# Patient Record
Sex: Female | Born: 1960 | Race: White | Hispanic: No | Marital: Married | Smoking: Former smoker
Health system: Southern US, Community
[De-identification: ages and names within clinical notes are randomized; demographics above are authoritative.]

## PROBLEM LIST (undated history)

## (undated) DIAGNOSIS — J309 Allergic rhinitis, unspecified: Secondary | ICD-10-CM

## (undated) DIAGNOSIS — Z8719 Personal history of other diseases of the digestive system: Secondary | ICD-10-CM

## (undated) DIAGNOSIS — R918 Other nonspecific abnormal finding of lung field: Secondary | ICD-10-CM

## (undated) DIAGNOSIS — G4730 Sleep apnea, unspecified: Secondary | ICD-10-CM

## (undated) DIAGNOSIS — M722 Plantar fascial fibromatosis: Secondary | ICD-10-CM

## (undated) DIAGNOSIS — T7840XA Allergy, unspecified, initial encounter: Secondary | ICD-10-CM

## (undated) DIAGNOSIS — J45909 Unspecified asthma, uncomplicated: Secondary | ICD-10-CM

## (undated) DIAGNOSIS — G4733 Obstructive sleep apnea (adult) (pediatric): Secondary | ICD-10-CM

## (undated) DIAGNOSIS — K219 Gastro-esophageal reflux disease without esophagitis: Secondary | ICD-10-CM

## (undated) DIAGNOSIS — Z9989 Dependence on other enabling machines and devices: Secondary | ICD-10-CM

## (undated) HISTORY — DX: Unspecified asthma, uncomplicated: J45.909

## (undated) HISTORY — DX: Sleep apnea, unspecified: G47.30

## (undated) HISTORY — PX: UPPER GASTROINTESTINAL ENDOSCOPY: SHX188

## (undated) HISTORY — DX: Allergy, unspecified, initial encounter: T78.40XA

## (undated) HISTORY — DX: Other nonspecific abnormal finding of lung field: R91.8

## (undated) HISTORY — DX: Dependence on other enabling machines and devices: Z99.89

## (undated) HISTORY — DX: Morbid (severe) obesity due to excess calories: E66.01

## (undated) HISTORY — DX: Plantar fascial fibromatosis: M72.2

## (undated) HISTORY — DX: Obstructive sleep apnea (adult) (pediatric): G47.33

---

## 1997-12-11 ENCOUNTER — Other Ambulatory Visit: Admission: RE | Admit: 1997-12-11 | Discharge: 1997-12-11 | Payer: Self-pay | Admitting: Obstetrics and Gynecology

## 1998-12-12 ENCOUNTER — Other Ambulatory Visit: Admission: RE | Admit: 1998-12-12 | Discharge: 1998-12-12 | Payer: Self-pay | Admitting: Obstetrics and Gynecology

## 2000-03-04 ENCOUNTER — Other Ambulatory Visit: Admission: RE | Admit: 2000-03-04 | Discharge: 2000-03-04 | Payer: Self-pay | Admitting: Obstetrics and Gynecology

## 2001-06-21 ENCOUNTER — Other Ambulatory Visit: Admission: RE | Admit: 2001-06-21 | Discharge: 2001-06-21 | Payer: Self-pay | Admitting: Obstetrics and Gynecology

## 2002-08-04 ENCOUNTER — Other Ambulatory Visit: Admission: RE | Admit: 2002-08-04 | Discharge: 2002-08-04 | Payer: Self-pay | Admitting: Obstetrics and Gynecology

## 2003-10-09 ENCOUNTER — Other Ambulatory Visit: Admission: RE | Admit: 2003-10-09 | Discharge: 2003-10-09 | Payer: Self-pay | Admitting: Obstetrics and Gynecology

## 2004-10-21 ENCOUNTER — Other Ambulatory Visit: Admission: RE | Admit: 2004-10-21 | Discharge: 2004-10-21 | Payer: Self-pay | Admitting: Obstetrics and Gynecology

## 2005-03-17 HISTORY — PX: PLANTAR FASCIA SURGERY: SHX746

## 2012-07-14 ENCOUNTER — Ambulatory Visit (INDEPENDENT_AMBULATORY_CARE_PROVIDER_SITE_OTHER): Payer: BC Managed Care – PPO | Admitting: Surgery

## 2012-07-22 ENCOUNTER — Ambulatory Visit (INDEPENDENT_AMBULATORY_CARE_PROVIDER_SITE_OTHER): Payer: BC Managed Care – PPO | Admitting: General Surgery

## 2012-07-22 ENCOUNTER — Encounter (INDEPENDENT_AMBULATORY_CARE_PROVIDER_SITE_OTHER): Payer: Self-pay | Admitting: General Surgery

## 2012-07-22 VITALS — BP 122/96 | HR 72 | Temp 97.6°F | Resp 14 | Ht 60.0 in | Wt 208.6 lb

## 2012-07-22 DIAGNOSIS — Z6841 Body Mass Index (BMI) 40.0 and over, adult: Secondary | ICD-10-CM

## 2012-07-22 DIAGNOSIS — J45909 Unspecified asthma, uncomplicated: Secondary | ICD-10-CM | POA: Insufficient documentation

## 2012-07-22 NOTE — Patient Instructions (Signed)
We will start the process. Please call with any questions. We will need copy of your sleep study

## 2012-07-22 NOTE — Progress Notes (Signed)
Patient ID: Katherine Henry, female   DOB: [birth year], 52 y.o.   MRN: [id number]  Chief Complaint  Patient presents with  . Weight Loss Surgery    HPI Katherine Henry is a 52 y.o. female.   HPI 52 yo WF referred by Dr Brendolyn Patty for evaluation of weight loss surgery. She states that she is specifically interested in laparoscopic adjustable gastric band surgery. If the pills to her because it is reversible and not permanent. She has a friend who has had both a lap band as well as gastric bypass. After attending a seminar, she believes the LAP-BAND is the best procedure for her.  She states that she has struggled premature all of her adult life with her weight. She describes yo-yo dieting in the 2000s & 1990s. She has tried numerous attempts for sustained weight loss. She has tried [employer], [employer], bariatric clinic, physicians weight loss, Xenical, and phentermine. She was successful with physicians weight loss with a 50 pound weight loss but subsequently regained all the way back. Despite all these attempts she has been unable to achieve permanent weight loss.  Past Medical History  Diagnosis Date  . Plantar fasciitis     Past Surgical History  Procedure Laterality Date  . Cesarean section  1998    Family History  Problem Relation Age of Onset  . Cancer Father     cancer    Social History History  Substance Use Topics  . Smoking status: Former Games developer  . Smokeless tobacco: Not on file  . Alcohol Use: Yes    Allergies  Allergen Reactions  . Augmentin (Amoxicillin-Pot Clavulanate) Nausea And Vomiting    Current Outpatient Prescriptions  Medication Sig Dispense Refill  . ALBUTEROL SULFATE HFA IN Inhale into the lungs 2 (two) times daily.      . Azelastine-Fluticasone (DYMISTA NA) Place into the nose 2 (two) times daily.      Marland Kitchen EPINEPHrine (ADRENALIN) 0.1 MG/ML injection Inject 0.3 mg into the vein once.      . loratadine (CLARITIN) 10 MG tablet Take 10 mg by mouth at  bedtime as needed for allergies.      . mometasone-formoterol (DULERA) 100-5 MCG/ACT AERO Inhale 2 puffs into the lungs 2 (two) times daily.      . montelukast (SINGULAIR) 10 MG tablet Take 10 mg by mouth every morning.      . zolpidem (AMBIEN) 10 MG tablet Take 10 mg by mouth at bedtime as needed for sleep.       No current facility-administered medications for this visit.    Review of Systems Review of Systems  Constitutional: Negative for fever, chills and unexpected weight change.  HENT: Negative for hearing loss, congestion, sore throat, trouble swallowing and voice change.   Eyes: Negative for visual disturbance.  Respiratory: Negative for cough, chest tightness, shortness of breath and wheezing.        +asthma. Sees a pulmonologist. Getting a sleep study in [place] in the next week.   Cardiovascular: Negative for chest pain, palpitations and leg swelling.       Some LE edema. No PND. No orthopnea. No claudication  Gastrointestinal: Negative for nausea, vomiting, abdominal pain, diarrhea, constipation, blood in stool, abdominal distention and anal bleeding.       BM q vs qoday. Denies reflux  Genitourinary: Negative for hematuria, vaginal bleeding and difficulty urinating.       Wears IUD. G1P1.   Musculoskeletal: Negative for arthralgias.  Has b/l hip and knee pain  Skin: Negative for rash and wound.  Neurological: Negative for seizures, syncope and headaches.       Denies TIA, amaurosis fugax  Hematological: Negative for adenopathy. Does not bruise/bleed easily.  Psychiatric/Behavioral: Negative for confusion.    Blood pressure 122/96, pulse 72, temperature 97.6 F (36.4 C), resp. rate 14, height 5' (1.524 m), weight 208 lb 9.6 oz (94.62 kg).  Physical Exam Physical Exam  Constitutional: She is oriented to person, place, and time. She appears well-developed and well-nourished. No distress.  Truncal obesity  HENT:  Head: Normocephalic and atraumatic.  Right Ear:  External ear normal.  Left Ear: External ear normal.  Eyes: Conjunctivae and EOM are normal. No scleral icterus.  Neck: Normal range of motion. Neck supple. No tracheal deviation present. No thyromegaly present.  Cardiovascular: Normal rate, normal heart sounds and intact distal pulses.   Pulmonary/Chest: Effort normal and breath sounds normal. No respiratory distress. She has no wheezes.  Abdominal: Soft. She exhibits no distension. There is no tenderness. There is no rebound and no guarding.    Musculoskeletal: Normal range of motion. She exhibits no edema and no tenderness.  Lymphadenopathy:    She has no cervical adenopathy.  Neurological: She is alert and oriented to person, place, and time. She exhibits normal muscle tone.  Skin: Skin is warm and dry. No rash noted. She is not diaphoretic. No erythema.  Psychiatric: She has a normal mood and affect. Her behavior is normal. Judgment and thought content normal.    Data Reviewed Diet history form  Assessment    Morbid obesity BMI 40. 74 Asthma Joint Pain     Plan    The patient meets weight loss surgery criteria. I think the patient would be an acceptable candidate for Laparoscopic adjustable gastric band placement.  We discussed laparoscopic adjustable gastric banding. The patient was given Agricultural engineer. We discussed the risk and benefits of surgery including but not limited to bleeding, infection, injury to surrounding structures, blood clot formation such as deep venous thrombosis or pulmonary embolism, need to convert to an open procedure, band slippage, band erosion, failure to loose weight, port complications (leak or flippage), potential need for reoperative surgery, esophageal dilatation, worsening reflux, and vitamin deficiencies. We discussed the typical post operative recovery course. We discussed that their postoperative diet will be modified for several weeks. We specifically talked about the need to be on a  liquid diet for one to 2 weeks after surgery. We also discussed the typical postoperative course with a laparoscopic adjustable gastric band and the need for frequent postoperative visits to assess the volume status of the band.  We discussed the typical expected weight loss with a laparoscopic adjustable gastric band. I explained to the patient that they can expect to lose 40-60% of their excess body weight if they are compliant with their postoperative instructions. However I did explain that some patients loose less than 40% and some patients lose more than 60% of their excess body weight.  I explained that the likelihood of improvement in their obesity is good.  I explained to the patient that we will start our evaluation process which includes labs, Upper GI to evaluate stomach and swallowing anatomy, nutritionist consultation, psychiatrist consultation, EKG, CXR, abdominal ultrasound.  She is scheduled to get her mammogram next month and we will request a copy of that. She is also scheduled to get a sleep study done in  in the next week or so.  We will obtain results of that. She states he also had recent lab work done at her primary care physician's office. We will get copies of that. We will order any missing labs at that time.  [name]. Andrey Campanile, MD, FACS General, Bariatric, & Minimally Invasive Surgery [facility], [place]           [facility] [place] M 07/22/2012, 5:24 PM

## 2012-07-26 ENCOUNTER — Telehealth (INDEPENDENT_AMBULATORY_CARE_PROVIDER_SITE_OTHER): Payer: Self-pay | Admitting: General Surgery

## 2012-07-26 NOTE — Addendum Note (Signed)
Addended byLiliana Cline on: 07/26/2012 12:44 PM   Modules accepted: Orders

## 2012-07-26 NOTE — Addendum Note (Signed)
Addended byLiliana Cline on: 07/26/2012 12:48 PM   Modules accepted: Orders

## 2012-07-26 NOTE — Telephone Encounter (Signed)
Called patient and made her aware we received recent blood work results from her PCP. They were missing an H. Pylori, T4 and Coags. I put in orders for remaining lab work and patient will have drawn at her local [facility]. She will call with any questions.

## 2012-08-04 ENCOUNTER — Ambulatory Visit: Payer: Self-pay | Admitting: *Deleted

## 2012-08-04 ENCOUNTER — Encounter (INDEPENDENT_AMBULATORY_CARE_PROVIDER_SITE_OTHER): Payer: Self-pay

## 2012-08-04 LAB — T4: T4, Total: 8.1 ug/dL (ref 4.5–12.0)

## 2012-08-04 LAB — PROTIME-INR: Prothrombin Time: 10.2 s (ref 9.1–12.0)

## 2012-08-24 ENCOUNTER — Encounter: Payer: BC Managed Care – PPO | Attending: General Surgery | Admitting: *Deleted

## 2012-08-24 ENCOUNTER — Encounter: Payer: Self-pay | Admitting: *Deleted

## 2012-08-24 VITALS — Ht 60.0 in | Wt 207.9 lb

## 2012-08-24 DIAGNOSIS — Z713 Dietary counseling and surveillance: Secondary | ICD-10-CM | POA: Insufficient documentation

## 2012-08-24 DIAGNOSIS — Z01818 Encounter for other preprocedural examination: Secondary | ICD-10-CM | POA: Insufficient documentation

## 2012-08-24 NOTE — Patient Instructions (Addendum)
   Follow Pre-Op Nutrition Goals to prepare for Bariatric Surgery.   Call the [facility] at [phone] once you have been given your surgery date to enrolled in the Pre-Op Nutrition Class. You will need to attend this nutrition class 3-4 weeks prior to your surgery.  

## 2012-08-30 ENCOUNTER — Ambulatory Visit (HOSPITAL_COMMUNITY)
Admission: RE | Admit: 2012-08-30 | Discharge: 2012-08-30 | Disposition: A | Discharge status: Home / Self Care | Payer: BC Managed Care – PPO | Source: Ambulatory Visit | Attending: General Surgery | Admitting: General Surgery

## 2012-08-30 ENCOUNTER — Other Ambulatory Visit: Payer: Self-pay

## 2012-08-30 DIAGNOSIS — M722 Plantar fascial fibromatosis: Secondary | ICD-10-CM | POA: Insufficient documentation

## 2012-08-30 DIAGNOSIS — J45909 Unspecified asthma, uncomplicated: Secondary | ICD-10-CM | POA: Insufficient documentation

## 2012-08-30 DIAGNOSIS — M2550 Pain in unspecified joint: Secondary | ICD-10-CM | POA: Insufficient documentation

## 2012-08-30 DIAGNOSIS — Z6841 Body Mass Index (BMI) 40.0 and over, adult: Secondary | ICD-10-CM | POA: Insufficient documentation

## 2012-08-30 DIAGNOSIS — K449 Diaphragmatic hernia without obstruction or gangrene: Secondary | ICD-10-CM | POA: Insufficient documentation

## 2012-08-30 DIAGNOSIS — Z87891 Personal history of nicotine dependence: Secondary | ICD-10-CM | POA: Insufficient documentation

## 2012-09-07 ENCOUNTER — Other Ambulatory Visit (INDEPENDENT_AMBULATORY_CARE_PROVIDER_SITE_OTHER): Payer: Self-pay | Admitting: General Surgery

## 2012-09-19 ENCOUNTER — Encounter: Payer: Self-pay | Admitting: *Deleted

## 2012-09-19 NOTE — Progress Notes (Addendum)
  Pre-Op Assessment Visit:  Pre-Operative Sleeve Gastrectomy Surgery  Medical Nutrition Therapy:  Appt start time: 0800   End time:  0900.  Patient was seen on 08/24/12 for Pre-Operative Sleeve Gastrectomy Nutrition Assessment. Assessment and letter of approval faxed to [facility] Bariatric Surgery Program coordinator on 09/03/12.  Approval letter sent to [facility] center and will be available in the chart under the media tab.  Handouts given during visit include:  Pre-Op Goals   Bariatric Surgery Protein Shakes  Samples given during visit include:   Unjury Protein Powder: 2 pkts Lot: [id number]; Exp: 09/15  Patient to call for Pre-Op and Post-Op Nutrition Education at the [facility] when surgery is scheduled.

## 2012-09-21 ENCOUNTER — Telehealth (INDEPENDENT_AMBULATORY_CARE_PROVIDER_SITE_OTHER): Payer: Self-pay | Admitting: General Surgery

## 2012-09-21 NOTE — Telephone Encounter (Signed)
Message copied by June Leap on Tue Sep 21, 2012  4:29 PM ------      Message from: Leandrew Koyanagi      Created: Wed Sep 15, 2012 10:20 AM      Regarding: CPAP/Sleep Study       Hey Dr. Andrey Campanile,            I spoke to Weaver this morning and she states she has finished the sleep study and is on CPAP now. She said the CPAP is really helping her. I scheduled her surgery for 10/18/12. She will see you for pre-op on 10/08/12. She states her doctor sent the results/reports and I advised that we have not received them yet. I asked if she will contact them and have the results refaxed.            Thanks,      Coy Saunas ------

## 2012-10-07 ENCOUNTER — Encounter: Payer: BC Managed Care – PPO | Attending: General Surgery

## 2012-10-07 VITALS — Ht 60.0 in | Wt 212.5 lb

## 2012-10-07 DIAGNOSIS — Z713 Dietary counseling and surveillance: Secondary | ICD-10-CM | POA: Insufficient documentation

## 2012-10-07 DIAGNOSIS — Z01818 Encounter for other preprocedural examination: Secondary | ICD-10-CM | POA: Insufficient documentation

## 2012-10-08 ENCOUNTER — Encounter (INDEPENDENT_AMBULATORY_CARE_PROVIDER_SITE_OTHER): Payer: Self-pay | Admitting: General Surgery

## 2012-10-08 ENCOUNTER — Encounter (HOSPITAL_COMMUNITY): Payer: Self-pay | Admitting: Pharmacy Technician

## 2012-10-08 ENCOUNTER — Ambulatory Visit (INDEPENDENT_AMBULATORY_CARE_PROVIDER_SITE_OTHER): Payer: BC Managed Care – PPO | Admitting: General Surgery

## 2012-10-08 VITALS — BP 108/72 | HR 76 | Temp 97.5°F | Resp 16 | Ht 60.0 in | Wt 216.8 lb

## 2012-10-08 DIAGNOSIS — Z6841 Body Mass Index (BMI) 40.0 and over, adult: Secondary | ICD-10-CM

## 2012-10-08 DIAGNOSIS — Z9989 Dependence on other enabling machines and devices: Secondary | ICD-10-CM | POA: Insufficient documentation

## 2012-10-08 DIAGNOSIS — G4733 Obstructive sleep apnea (adult) (pediatric): Secondary | ICD-10-CM | POA: Insufficient documentation

## 2012-10-08 NOTE — Patient Instructions (Signed)
Exercise daily Keep up with the preop diet Bring CPAP mask to hospital

## 2012-10-08 NOTE — Progress Notes (Signed)
Patient ID: Katherine Henry, female   DOB: 1961-02-11, 52 y.o.   MRN: [id number]  Chief Complaint  Patient presents with  . Bariatric Pre-op    HPI Katherine Henry is a 52 y.o. female.  HPI 52 year old morbidly obese Caucasian female comes in today for her preoperative appointment. She has been approved for laparoscopic adjustable gastric band surgery which is currently scheduled for August 4th. I initially saw her on 07/22/2012. She denies any significant changes to her medical history other than she tested positive for sleep apnea. She was fitted for CPAP. She reports increased energy since she has been using the CPAP machine. She denies any trips to the hospital. She just started her preoperative diet yesterday.  PMHx, PSHx, SOCHx, FAMHx, ALL reviewed   Past Medical History  Diagnosis Date  . Plantar fasciitis   . Morbid obesity   . Asthma   . OSA on CPAP     Past Surgical History  Procedure Laterality Date  . Cesarean section  1998  . Plantar fascia surgery      Patient reported    Family History  Problem Relation Age of Onset  . Cancer Father     cancer    Social History History  Substance Use Topics  . Smoking status: Former Smoker    Quit date: 08/24/1992  . Smokeless tobacco: Not on file  . Alcohol Use: Yes     Comment: Rare; Holidays    Allergies  Allergen Reactions  . Augmentin (Amoxicillin-Pot Clavulanate) Nausea And Vomiting    Current Outpatient Prescriptions  Medication Sig Dispense Refill  . ALBUTEROL SULFATE HFA IN Inhale into the lungs 2 (two) times daily.      . Azelastine-Fluticasone (DYMISTA NA) Place into the nose 2 (two) times daily.      Marland Kitchen EPINEPHrine (ADRENALIN) 0.1 MG/ML injection Inject 0.3 mg into the vein once.      . ergocalciferol (VITAMIN D2) 50000 UNITS capsule Take 50,000 Units by mouth once a week.      . loratadine (CLARITIN) 10 MG tablet Take 10 mg by mouth at bedtime as needed for allergies.      . mometasone-formoterol (DULERA)  100-5 MCG/ACT AERO Inhale 2 puffs into the lungs 2 (two) times daily.      . montelukast (SINGULAIR) 10 MG tablet Take 10 mg by mouth every morning.      Marland Kitchen UNABLE TO FIND Inject into the skin once a week. Med Name: ALLERGY SHOT      . zolpidem (AMBIEN) 10 MG tablet Take 10 mg by mouth at bedtime as needed for sleep.       No current facility-administered medications for this visit.    Review of Systems Review of Systems  Constitutional: Negative for fever, chills and unexpected weight change.  HENT: Negative for hearing loss, congestion, sore throat, trouble swallowing and voice change.   Eyes: Negative for visual disturbance.  Respiratory: Negative for cough and wheezing.        Positive asthma. Tested positive for sleep apnea. Has started CPAP.  Cardiovascular: Negative for chest pain, palpitations and leg swelling.  Gastrointestinal: Negative for nausea, vomiting, abdominal pain, diarrhea, constipation and blood in stool.       Denies reflux  Genitourinary: Negative for hematuria, vaginal bleeding and difficulty urinating.  Musculoskeletal: Negative for arthralgias.       Bilateral hip and knee pain  Skin: Negative for rash and wound.  Neurological: Negative for dizziness, seizures, syncope and headaches.  Hematological:  Negative for adenopathy. Does not bruise/bleed easily.  Psychiatric/Behavioral: Negative for confusion.    Blood pressure 108/72, pulse 76, temperature 97.5 F (36.4 C), temperature source Oral, resp. rate 16, height 5' (1.524 m), weight 216 lb 12.8 oz (98.34 kg).  Physical Exam Physical Exam  Vitals reviewed. Constitutional: She is oriented to person, place, and time. She appears well-developed and well-nourished. No distress.  Morbidly obese  HENT:  Head: Normocephalic and atraumatic.  Right Ear: External ear normal.  Left Ear: External ear normal.  Eyes: Conjunctivae are normal. No scleral icterus.  Neck: Normal range of motion. Neck supple. No tracheal  deviation present. No thyromegaly present.  Cardiovascular: Normal rate and normal heart sounds.   Pulmonary/Chest: Effort normal and breath sounds normal. No stridor. No respiratory distress. She has no wheezes.  Abdominal: Soft. She exhibits no distension. There is no tenderness. There is no rebound and no guarding.    Musculoskeletal: She exhibits no edema and no tenderness.  Lymphadenopathy:    She has no cervical adenopathy.  Neurological: She is alert and oriented to person, place, and time. She exhibits normal muscle tone.  Skin: Skin is warm and dry. No rash noted. She is not diaphoretic. No erythema.  Psychiatric: She has a normal mood and affect. Her behavior is normal. Judgment and thought content normal.    Data Reviewed My office note 5/8 Labs - chol 208, LDL 149; o/w eval labs ok UGI - small hiatal hernia U/s - normal Sleep study - +OSA  Assessment    Morbid obesity BMI 42.3 OSA on CPAP Small hiatal hernia Dyslipidemia Asthma Joint pain     Plan    We reviewed her preoperative workup. We discussed the findings of a small hiatal hernia on her upper GI. Currently she has no symptoms of GERD. However she does have asthma. I told her we would test for a hiatal hernia during surgery. If we found that she had a significant hernia during surgery we would do a hiatal hernia repair during surgery. We reviewed her preoperative labs and discussed her elevated cholesterol and LDL lipids. She was told to bring her CPAP mask with her to the hospital.  We discussed her weight gain since her initial visit. Her weight during her initial visit was 208 pounds. Today is 216.8. We discussed the importance of weight loss during her preoperative diet plan in order to help shrink her liver. We discussed the importance of routine diet and exercise after surgery in order for her to achieve her goals. All of her questions were asked and answered.  Katherine Henry. Andrey Campanile, MD, FACS General, Bariatric,  & Minimally Invasive Surgery [facility], [place]         [facility] M 10/08/2012, 11:32 AM

## 2012-10-12 NOTE — Progress Notes (Signed)
OV Dr Esther Hardy 6/14 on chart, with Sleep study.  Chest x ray 5/14 EPIC, EKG 6/14 EPIC

## 2012-10-12 NOTE — Patient Instructions (Addendum)
20 Katherine Henry  10/12/2012   Your procedure is scheduled on:  10/18/12  MONDAY  Report to [facility] at 0715      AM.  Call this number if you have problems the morning of surgery: [phone]     BRING CPAP MASK AND TUBING WITH YOU TO HOSPITAL  Remember: Janan Halter WITH YOU TO HOSPITAL  Do not eat food  Or drink :After Midnight. Sunday NIGHT   Take these medicines the morning of surgery with A SIP OF WATER: Singulair, Pepcid,                    Dymista, Dulura                                      May use albuterol if needed   .  Contacts, dentures or partial plates can not be worn to surgery  Leave suitcase in the car. After surgery it may be brought to your room.  For patients admitted to the hospital, checkout time is 11:00 AM day of  discharge.             SPECIAL INSTRUCTIONS- SEE [place] PREPARING FOR SURGERY INSTRUCTION SHEET-     DO NOT WEAR JEWELRY, LOTIONS, POWDERS, OR PERFUMES.  WOMEN-- DO NOT SHAVE LEGS OR UNDERARMS FOR 12 HOURS BEFORE SHOWERS. MEN MAY SHAVE FACE.  Patients discharged the day of surgery will not be allowed to drive home. IF going home the day of surgery, you must have a driver and someone to stay with you for the first 24 hours  Name and phone number of your driver:   Molly Maduro  husband                                                                       Alen Matheson  PST [phone]  [phone]                 FAILURE TO FOLLOW THESE INSTRUCTIONS MAY RESULT IN  CANCELLATION   OF YOUR SURGERY                                                  Patient Signature _____________________________

## 2012-10-13 ENCOUNTER — Encounter (HOSPITAL_COMMUNITY): Payer: Self-pay

## 2012-10-13 ENCOUNTER — Encounter (HOSPITAL_COMMUNITY)
Admission: RE | Admit: 2012-10-13 | Discharge: 2012-10-13 | Disposition: A | Discharge status: Home / Self Care | Payer: BC Managed Care – PPO | Source: Ambulatory Visit | Attending: General Surgery | Admitting: General Surgery

## 2012-10-13 DIAGNOSIS — Z01812 Encounter for preprocedural laboratory examination: Secondary | ICD-10-CM | POA: Insufficient documentation

## 2012-10-13 HISTORY — DX: Allergic rhinitis, unspecified: J30.9

## 2012-10-13 HISTORY — DX: Personal history of other diseases of the digestive system: Z87.19

## 2012-10-13 HISTORY — DX: Gastro-esophageal reflux disease without esophagitis: K21.9

## 2012-10-13 LAB — CBC WITH DIFFERENTIAL/PLATELET
Basophils Absolute: 0 10*3/uL (ref 0.0–0.1)
Basophils Relative: 1 % (ref 0–1)
Hemoglobin: 14 g/dL (ref 12.0–15.0)
Lymphocytes Relative: 16 % (ref 12–46)
MCHC: 33.9 g/dL (ref 30.0–36.0)
Monocytes Relative: 11 % (ref 3–12)
Neutro Abs: 5.4 10*3/uL (ref 1.7–7.7)
Neutrophils Relative %: 70 % (ref 43–77)
WBC: 7.7 10*3/uL (ref 4.0–10.5)

## 2012-10-13 LAB — COMPREHENSIVE METABOLIC PANEL
AST: 34 U/L (ref 0–37)
Albumin: 3.5 g/dL (ref 3.5–5.2)
Alkaline Phosphatase: 113 U/L (ref 39–117)
BUN: 17 mg/dL (ref 6–23)
Chloride: 102 mEq/L (ref 96–112)
Potassium: 4 mEq/L (ref 3.5–5.1)
Total Bilirubin: 0.2 mg/dL — ABNORMAL LOW (ref 0.3–1.2)

## 2012-10-17 MED ORDER — LEVOFLOXACIN IN D5W 750 MG/150ML IV SOLN
750.0000 mg | INTRAVENOUS | Status: AC
  Administered 2012-10-18: 750 mg via INTRAVENOUS
  Filled 2012-10-17 (×2): qty 150

## 2012-10-18 ENCOUNTER — Ambulatory Visit (HOSPITAL_COMMUNITY)
Admission: RE | Admit: 2012-10-18 | Discharge: 2012-10-19 | Disposition: A | Discharge status: Home / Self Care | Payer: BC Managed Care – PPO | Source: Ambulatory Visit | Attending: General Surgery | Admitting: General Surgery

## 2012-10-18 ENCOUNTER — Encounter (HOSPITAL_COMMUNITY): Payer: Self-pay | Admitting: Anesthesiology

## 2012-10-18 ENCOUNTER — Encounter (HOSPITAL_COMMUNITY)
Admission: RE | Disposition: A | Discharge status: Home / Self Care | Payer: Self-pay | Source: Ambulatory Visit | Attending: General Surgery

## 2012-10-18 ENCOUNTER — Encounter (HOSPITAL_COMMUNITY): Payer: Self-pay | Admitting: *Deleted

## 2012-10-18 ENCOUNTER — Ambulatory Visit (HOSPITAL_COMMUNITY): Payer: BC Managed Care – PPO | Admitting: Anesthesiology

## 2012-10-18 DIAGNOSIS — M2550 Pain in unspecified joint: Secondary | ICD-10-CM | POA: Insufficient documentation

## 2012-10-18 DIAGNOSIS — G4733 Obstructive sleep apnea (adult) (pediatric): Secondary | ICD-10-CM

## 2012-10-18 DIAGNOSIS — E785 Hyperlipidemia, unspecified: Secondary | ICD-10-CM | POA: Diagnosis present

## 2012-10-18 DIAGNOSIS — Z9884 Bariatric surgery status: Secondary | ICD-10-CM

## 2012-10-18 DIAGNOSIS — Z9989 Dependence on other enabling machines and devices: Secondary | ICD-10-CM | POA: Diagnosis present

## 2012-10-18 DIAGNOSIS — K219 Gastro-esophageal reflux disease without esophagitis: Secondary | ICD-10-CM | POA: Insufficient documentation

## 2012-10-18 DIAGNOSIS — J45909 Unspecified asthma, uncomplicated: Secondary | ICD-10-CM | POA: Diagnosis present

## 2012-10-18 DIAGNOSIS — Z6841 Body Mass Index (BMI) 40.0 and over, adult: Secondary | ICD-10-CM | POA: Insufficient documentation

## 2012-10-18 DIAGNOSIS — K449 Diaphragmatic hernia without obstruction or gangrene: Secondary | ICD-10-CM | POA: Insufficient documentation

## 2012-10-18 DIAGNOSIS — Z79899 Other long term (current) drug therapy: Secondary | ICD-10-CM | POA: Insufficient documentation

## 2012-10-18 DIAGNOSIS — E66813 Obesity, class 3: Secondary | ICD-10-CM | POA: Diagnosis present

## 2012-10-18 HISTORY — PX: LAPAROSCOPIC GASTRIC BANDING WITH HIATAL HERNIA REPAIR: SHX6351

## 2012-10-18 SURGERY — GASTRIC BANDING, LAPAROSCOPIC, WITH HIATAL HERNIA REPAIR
Anesthesia: General | Site: Abdomen

## 2012-10-18 MED ORDER — PROMETHAZINE HCL 25 MG/ML IJ SOLN
INTRAMUSCULAR | Status: AC
  Filled 2012-10-18: qty 1

## 2012-10-18 MED ORDER — PROPOFOL 10 MG/ML IV BOLUS
INTRAVENOUS | Status: DC | PRN
  Administered 2012-10-18: 200 mg via INTRAVENOUS

## 2012-10-18 MED ORDER — BUPIVACAINE-EPINEPHRINE 0.25% -1:200000 IJ SOLN
INTRAMUSCULAR | Status: DC | PRN
  Administered 2012-10-18: 42 mL

## 2012-10-18 MED ORDER — PANTOPRAZOLE SODIUM 40 MG IV SOLR
40.0000 mg | INTRAVENOUS | Status: DC
  Administered 2012-10-18: 40 mg via INTRAVENOUS
  Filled 2012-10-18 (×2): qty 40

## 2012-10-18 MED ORDER — LACTATED RINGERS IV SOLN
INTRAVENOUS | Status: DC | PRN
  Administered 2012-10-18 (×2): via INTRAVENOUS

## 2012-10-18 MED ORDER — ALBUTEROL SULFATE (5 MG/ML) 0.5% IN NEBU
2.5000 mg | INHALATION_SOLUTION | Freq: Two times a day (BID) | RESPIRATORY_TRACT | Status: DC
  Administered 2012-10-18: 2.5 mg via RESPIRATORY_TRACT
  Filled 2012-10-18 (×2): qty 0.5

## 2012-10-18 MED ORDER — UNJURY CHICKEN SOUP POWDER: 2.0000 [oz_av] | Freq: Four times a day (QID) | ORAL | Status: DC

## 2012-10-18 MED ORDER — FENTANYL CITRATE 0.05 MG/ML IJ SOLN
INTRAMUSCULAR | Status: AC
  Filled 2012-10-18: qty 2

## 2012-10-18 MED ORDER — MIDAZOLAM HCL 5 MG/5ML IJ SOLN
INTRAMUSCULAR | Status: DC | PRN
  Administered 2012-10-18: 2 mg via INTRAVENOUS

## 2012-10-18 MED ORDER — ALBUTEROL SULFATE HFA 108 (90 BASE) MCG/ACT IN AERS
2.0000 | INHALATION_SPRAY | Freq: Four times a day (QID) | RESPIRATORY_TRACT | Status: DC | PRN
  Filled 2012-10-18: qty 6.7

## 2012-10-18 MED ORDER — ACETAMINOPHEN 160 MG/5ML PO SOLN: 650.0000 mg | ORAL | Status: DC | PRN

## 2012-10-18 MED ORDER — LACTATED RINGERS IV SOLN: INTRAVENOUS | Status: DC

## 2012-10-18 MED ORDER — KCL IN DEXTROSE-NACL 20-5-0.45 MEQ/L-%-% IV SOLN
INTRAVENOUS | Status: DC
  Administered 2012-10-18: 14:00:00 via INTRAVENOUS
  Administered 2012-10-18 – 2012-10-19 (×2): 125 mL/h via INTRAVENOUS
  Filled 2012-10-18 (×4): qty 1000

## 2012-10-18 MED ORDER — ONDANSETRON HCL 4 MG/2ML IJ SOLN
4.0000 mg | INTRAMUSCULAR | Status: DC | PRN
  Administered 2012-10-18 – 2012-10-19 (×3): 4 mg via INTRAVENOUS
  Filled 2012-10-18 (×3): qty 2

## 2012-10-18 MED ORDER — NEOSTIGMINE METHYLSULFATE 1 MG/ML IJ SOLN
INTRAMUSCULAR | Status: DC | PRN
  Administered 2012-10-18: 5 mg via INTRAVENOUS

## 2012-10-18 MED ORDER — FENTANYL CITRATE 0.05 MG/ML IJ SOLN
INTRAMUSCULAR | Status: DC | PRN
  Administered 2012-10-18: 50 ug via INTRAVENOUS
  Administered 2012-10-18: 100 ug via INTRAVENOUS
  Administered 2012-10-18 (×2): 50 ug via INTRAVENOUS

## 2012-10-18 MED ORDER — DEXAMETHASONE SODIUM PHOSPHATE 10 MG/ML IJ SOLN
INTRAMUSCULAR | Status: DC | PRN
  Administered 2012-10-18: 10 mg via INTRAVENOUS

## 2012-10-18 MED ORDER — UNJURY CHOCOLATE CLASSIC POWDER
2.0000 [oz_av] | Freq: Four times a day (QID) | ORAL | Status: DC
  Administered 2012-10-19: 2 [oz_av] via ORAL

## 2012-10-18 MED ORDER — GLYCOPYRROLATE 0.2 MG/ML IJ SOLN
INTRAMUSCULAR | Status: DC | PRN
  Administered 2012-10-18: .8 mg via INTRAVENOUS

## 2012-10-18 MED ORDER — BUPIVACAINE-EPINEPHRINE 0.25% -1:200000 IJ SOLN
INTRAMUSCULAR | Status: AC
  Filled 2012-10-18: qty 1

## 2012-10-18 MED ORDER — SUCCINYLCHOLINE CHLORIDE 20 MG/ML IJ SOLN
INTRAMUSCULAR | Status: DC | PRN
  Administered 2012-10-18: 100 mg via INTRAVENOUS

## 2012-10-18 MED ORDER — LIDOCAINE HCL (CARDIAC) 20 MG/ML IV SOLN
INTRAVENOUS | Status: DC | PRN
  Administered 2012-10-18: 100 mg via INTRAVENOUS

## 2012-10-18 MED ORDER — AZELASTINE-FLUTICASONE 137-50 MCG/ACT NA SUSP: 1.0000 | Freq: Two times a day (BID) | NASAL | Status: DC

## 2012-10-18 MED ORDER — OXYCODONE-ACETAMINOPHEN 5-325 MG/5ML PO SOLN
5.0000 mL | ORAL | Status: DC | PRN
  Administered 2012-10-19: 10 mL via ORAL
  Filled 2012-10-18: qty 10

## 2012-10-18 MED ORDER — ROCURONIUM BROMIDE 100 MG/10ML IV SOLN
INTRAVENOUS | Status: DC | PRN
  Administered 2012-10-18: 10 mg via INTRAVENOUS
  Administered 2012-10-18: 40 mg via INTRAVENOUS

## 2012-10-18 MED ORDER — KETOROLAC TROMETHAMINE 30 MG/ML IJ SOLN
30.0000 mg | Freq: Four times a day (QID) | INTRAMUSCULAR | Status: DC | PRN
  Administered 2012-10-19: 30 mg via INTRAVENOUS
  Filled 2012-10-18: qty 1

## 2012-10-18 MED ORDER — UNJURY VANILLA POWDER: 2.0000 [oz_av] | Freq: Four times a day (QID) | ORAL | Status: DC

## 2012-10-18 MED ORDER — SODIUM CHLORIDE 0.9 % IJ SOLN
INTRAMUSCULAR | Status: DC | PRN
  Administered 2012-10-18: 20 mL via INTRAVENOUS

## 2012-10-18 MED ORDER — KCL IN DEXTROSE-NACL 20-5-0.45 MEQ/L-%-% IV SOLN
INTRAVENOUS | Status: AC
  Filled 2012-10-18: qty 1000

## 2012-10-18 MED ORDER — PROMETHAZINE HCL 25 MG/ML IJ SOLN
6.2500 mg | INTRAMUSCULAR | Status: AC | PRN
  Administered 2012-10-18 (×2): 12.5 mg via INTRAVENOUS

## 2012-10-18 MED ORDER — ONDANSETRON HCL 4 MG/2ML IJ SOLN
INTRAMUSCULAR | Status: DC | PRN
  Administered 2012-10-18: 4 mg via INTRAVENOUS

## 2012-10-18 MED ORDER — HEPARIN SODIUM (PORCINE) 5000 UNIT/ML IJ SOLN
5000.0000 [IU] | INTRAMUSCULAR | Status: AC
  Administered 2012-10-18: 5000 [IU] via SUBCUTANEOUS
  Filled 2012-10-18: qty 1

## 2012-10-18 MED ORDER — ALBUTEROL (5 MG/ML) CONTINUOUS INHALATION SOLN: 1.0000 mL | INHALATION_SOLUTION | Freq: Two times a day (BID) | RESPIRATORY_TRACT | Status: DC

## 2012-10-18 MED ORDER — MORPHINE SULFATE 2 MG/ML IJ SOLN
2.0000 mg | INTRAMUSCULAR | Status: DC | PRN
  Administered 2012-10-18: 4 mg via INTRAVENOUS
  Administered 2012-10-18: 6 mg via INTRAVENOUS
  Administered 2012-10-18 – 2012-10-19 (×3): 4 mg via INTRAVENOUS
  Administered 2012-10-19: 6 mg via INTRAVENOUS
  Filled 2012-10-18: qty 2
  Filled 2012-10-18: qty 3
  Filled 2012-10-18 (×3): qty 2
  Filled 2012-10-18: qty 3

## 2012-10-18 MED ORDER — FENTANYL CITRATE 0.05 MG/ML IJ SOLN
25.0000 ug | INTRAMUSCULAR | Status: DC | PRN
  Administered 2012-10-18 (×2): 50 ug via INTRAVENOUS

## 2012-10-18 MED ORDER — MOMETASONE FURO-FORMOTEROL FUM 200-5 MCG/ACT IN AERO
2.0000 | INHALATION_SPRAY | Freq: Two times a day (BID) | RESPIRATORY_TRACT | Status: DC
  Administered 2012-10-18: 2 via RESPIRATORY_TRACT
  Filled 2012-10-18: qty 8.8

## 2012-10-18 MED ORDER — ENOXAPARIN SODIUM 40 MG/0.4ML ~~LOC~~ SOLN
40.0000 mg | Freq: Two times a day (BID) | SUBCUTANEOUS | Status: DC
  Administered 2012-10-19: 40 mg via SUBCUTANEOUS
  Filled 2012-10-18 (×3): qty 0.4

## 2012-10-18 SURGICAL SUPPLY — 56 items
ADH SKN CLS APL DERMABOND .7 (GAUZE/BANDAGES/DRESSINGS) ×1
APL SKNCLS STERI-STRIP NONHPOA (GAUZE/BANDAGES/DRESSINGS)
BAND LAP 10.0 W/TUBES (Band) ×2 IMPLANT
BENZOIN TINCTURE PRP APPL 2/3 (GAUZE/BANDAGES/DRESSINGS) IMPLANT
BLADE HEX COATED 2.75 (ELECTRODE) ×2 IMPLANT
BLADE SURG 15 STRL LF DISP TIS (BLADE) ×1 IMPLANT
BLADE SURG 15 STRL SS (BLADE) ×2
BLADE SURG SZ11 CARB STEEL (BLADE) ×2 IMPLANT
CANISTER SUCTION 2500CC (MISCELLANEOUS) ×2 IMPLANT
CHLORAPREP W/TINT 26ML (MISCELLANEOUS) ×2 IMPLANT
CLOTH BEACON ORANGE TIMEOUT ST (SAFETY) ×2 IMPLANT
DECANTER SPIKE VIAL GLASS SM (MISCELLANEOUS) ×2 IMPLANT
DERMABOND ADVANCED (GAUZE/BANDAGES/DRESSINGS) ×1
DERMABOND ADVANCED .7 DNX12 (GAUZE/BANDAGES/DRESSINGS) ×1 IMPLANT
DEVICE SUT QUICK LOAD TK 5 (STAPLE) ×8 IMPLANT
DEVICE SUT TI-KNOT TK 5X26 (MISCELLANEOUS) ×2 IMPLANT
DEVICE SUTURE ENDOST 10MM (ENDOMECHANICALS) ×2 IMPLANT
DISSECTOR BLUNT TIP ENDO 5MM (MISCELLANEOUS) ×2 IMPLANT
DRAPE CAMERA CLOSED 9X96 (DRAPES) ×2 IMPLANT
DRAPE UTILITY XL STRL (DRAPES) ×6 IMPLANT
ELECT REM PT RETURN 9FT ADLT (ELECTROSURGICAL) ×2
ELECTRODE REM PT RTRN 9FT ADLT (ELECTROSURGICAL) ×1 IMPLANT
GLOVE BIO SURGEON STRL SZ7.5 (GLOVE) ×2 IMPLANT
GLOVE BIOGEL M STRL SZ7.5 (GLOVE) ×2 IMPLANT
GLOVE INDICATOR 8.0 STRL GRN (GLOVE) ×2 IMPLANT
GOWN STRL NON-REIN LRG LVL3 (GOWN DISPOSABLE) ×2 IMPLANT
GOWN STRL REIN XL XLG (GOWN DISPOSABLE) ×8 IMPLANT
HOVERMATT SINGLE USE (MISCELLANEOUS) ×2 IMPLANT
KIT BASIN OR (CUSTOM PROCEDURE TRAY) ×2 IMPLANT
MESH HERNIA 1X4 RECT BARD (Mesh General) ×1 IMPLANT
MESH HERNIA BARD 1X4 (Mesh General) ×1 IMPLANT
NEEDLE SPNL 22GX3.5 QUINCKE BK (NEEDLE) ×2 IMPLANT
NS IRRIG 1000ML POUR BTL (IV SOLUTION) ×2 IMPLANT
PACK UNIVERSAL I (CUSTOM PROCEDURE TRAY) ×2 IMPLANT
PENCIL BUTTON HOLSTER BLD 10FT (ELECTRODE) ×2 IMPLANT
SCALPEL HARMONIC ACE (MISCELLANEOUS) IMPLANT
SET IRRIG TUBING LAPAROSCOPIC (IRRIGATION / IRRIGATOR) IMPLANT
SOLUTION ANTI FOG 6CC (MISCELLANEOUS) ×2 IMPLANT
SPONGE LAP 18X18 X RAY DECT (DISPOSABLE) ×2 IMPLANT
STAPLER VISISTAT 35W (STAPLE) ×2 IMPLANT
STRIP CLOSURE SKIN 1/2X4 (GAUZE/BANDAGES/DRESSINGS) IMPLANT
SUT ETHIBOND 2 0 SH (SUTURE) ×6
SUT ETHIBOND 2 0 SH 36X2 (SUTURE) ×3 IMPLANT
SUT MNCRL AB 4-0 PS2 18 (SUTURE) ×2 IMPLANT
SUT PROLENE 2 0 CT2 30 (SUTURE) ×2 IMPLANT
SUT SILK 0 (SUTURE) ×2
SUT SILK 0 30XBRD TIE 6 (SUTURE) ×1 IMPLANT
SUT SURGIDAC NAB ES-9 0 48 120 (SUTURE) ×2 IMPLANT
SUT VIC AB 2-0 SH 27 (SUTURE) ×2
SUT VIC AB 2-0 SH 27X BRD (SUTURE) ×1 IMPLANT
SYR 20CC LL (SYRINGE) ×2 IMPLANT
SYR CONTROL 10ML LL (SYRINGE) ×2 IMPLANT
TOWEL OR 17X26 10 PK STRL BLUE (TOWEL DISPOSABLE) ×2 IMPLANT
TROCAR BLADELESS OPT 5 100 (ENDOMECHANICALS) ×6 IMPLANT
TUBE CALIBRATION LAPBAND (TUBING) ×2 IMPLANT
TUBING INSUFFLATION 10FT LAP (TUBING) ×2 IMPLANT

## 2012-10-18 NOTE — Transfer of Care (Signed)
Immediate Anesthesia Transfer of Care Note  Patient: Katherine Henry  Procedure(s) Performed: Procedure(s): LAPAROSCOPIC GASTRIC BANDING WITH HIATAL HERNIA REPAIR (N/A)  Patient Location: PACU  Anesthesia Type:General  Level of Consciousness: awake, alert  and oriented  Airway & Oxygen Therapy: Patient Spontanous Breathing and Patient connected to face mask oxygen  Post-op Assessment: Report given to PACU RN and Post -op Vital signs reviewed and stable  Post vital signs: Reviewed and stable  Complications: No apparent anesthesia complications

## 2012-10-18 NOTE — Interval H&P Note (Signed)
History and Physical Interval Note:  10/18/2012 9:54 AM  Katherine Henry  has presented today for surgery, with the diagnosis of morbid obesity  The various methods of treatment have been discussed with the patient and family. After consideration of risks, benefits and other options for treatment, the patient has consented to  Procedure(s): LAPAROSCOPIC GASTRIC BANDING WITH HIATAL HERNIA REPAIR (N/A) as a surgical intervention .  The patient's history has been reviewed, patient examined, no change in status, stable for surgery.  I have reviewed the patient's chart and labs.  Questions were answered to the patient's satisfaction.    [name]. Andrey Campanile, MD, FACS General, Bariatric, & Minimally Invasive Surgery [facility], [place]   [facility] M

## 2012-10-18 NOTE — Anesthesia Postprocedure Evaluation (Signed)
  Anesthesia Post-op Note  Patient: Katherine Henry  Procedure(s) Performed: Procedure(s) (LRB): LAPAROSCOPIC GASTRIC BANDING WITH HIATAL HERNIA REPAIR (N/A)  Patient Location: PACU  Anesthesia Type: General  Level of Consciousness: awake and alert   Airway and Oxygen Therapy: Patient Spontanous Breathing  Post-op Pain: mild  Post-op Assessment: Post-op Vital signs reviewed, Patient's Cardiovascular Status Stable, Respiratory Function Stable, Patent Airway and No signs of Nausea or vomiting  Last Vitals:  Filed Vitals:   10/18/12 1321  BP: 103/54  Pulse: 71  Temp: 36.5 C  Resp: 16    Post-op Vital Signs: stable   Complications: No apparent anesthesia complications

## 2012-10-18 NOTE — H&P (View-Only) (Signed)
Patient ID: Katherine Henry, female   DOB: 03/22/1960, 52 y.o.   MRN: [id number]  Chief Complaint  Patient presents with  . Bariatric Pre-op    HPI Katherine Henry is a 52 y.o. female.  HPI 52-year-old morbidly obese Caucasian female comes in today for her preoperative appointment. She has been approved for laparoscopic adjustable gastric band surgery which is currently scheduled for August 4th. I initially saw her on 07/22/2012. She denies any significant changes to her medical history other than she tested positive for sleep apnea. She was fitted for CPAP. She reports increased energy since she has been using the CPAP machine. She denies any trips to the hospital. She just started her preoperative diet yesterday.  PMHx, PSHx, SOCHx, FAMHx, ALL reviewed   Past Medical History  Diagnosis Date  . Plantar fasciitis   . Morbid obesity   . Asthma   . OSA on CPAP     Past Surgical History  Procedure Laterality Date  . Cesarean section  1998  . Plantar fascia surgery      Patient reported    Family History  Problem Relation Age of Onset  . Cancer Father     cancer    Social History History  Substance Use Topics  . Smoking status: Former Smoker    Quit date: 08/24/1992  . Smokeless tobacco: Not on file  . Alcohol Use: Yes     Comment: Rare; Holidays    Allergies  Allergen Reactions  . Augmentin (Amoxicillin-Pot Clavulanate) Nausea And Vomiting    Current Outpatient Prescriptions  Medication Sig Dispense Refill  . ALBUTEROL SULFATE HFA IN Inhale into the lungs 2 (two) times daily.      . Azelastine-Fluticasone (DYMISTA NA) Place into the nose 2 (two) times daily.      . EPINEPHrine (ADRENALIN) 0.1 MG/ML injection Inject 0.3 mg into the vein once.      . ergocalciferol (VITAMIN D2) 50000 UNITS capsule Take 50,000 Units by mouth once a week.      . loratadine (CLARITIN) 10 MG tablet Take 10 mg by mouth at bedtime as needed for allergies.      . mometasone-formoterol (DULERA)  100-5 MCG/ACT AERO Inhale 2 puffs into the lungs 2 (two) times daily.      . montelukast (SINGULAIR) 10 MG tablet Take 10 mg by mouth every morning.      . UNABLE TO FIND Inject into the skin once a week. Med Name: ALLERGY SHOT      . zolpidem (AMBIEN) 10 MG tablet Take 10 mg by mouth at bedtime as needed for sleep.       No current facility-administered medications for this visit.    Review of Systems Review of Systems  Constitutional: Negative for fever, chills and unexpected weight change.  HENT: Negative for hearing loss, congestion, sore throat, trouble swallowing and voice change.   Eyes: Negative for visual disturbance.  Respiratory: Negative for cough and wheezing.        Positive asthma. Tested positive for sleep apnea. Has started CPAP.  Cardiovascular: Negative for chest pain, palpitations and leg swelling.  Gastrointestinal: Negative for nausea, vomiting, abdominal pain, diarrhea, constipation and blood in stool.       Denies reflux  Genitourinary: Negative for hematuria, vaginal bleeding and difficulty urinating.  Musculoskeletal: Negative for arthralgias.       Bilateral hip and knee pain  Skin: Negative for rash and wound.  Neurological: Negative for dizziness, seizures, syncope and headaches.  Hematological:   Negative for adenopathy. Does not bruise/bleed easily.  Psychiatric/Behavioral: Negative for confusion.    Blood pressure 108/72, pulse 76, temperature 97.5 F (36.4 C), temperature source Oral, resp. rate 16, height 5' (1.524 Henry), weight 216 lb 12.8 oz (98.34 kg).  Physical Exam Physical Exam  Vitals reviewed. Constitutional: She is oriented to person, place, and time. She appears well-developed and well-nourished. No distress.  Morbidly obese  HENT:  Head: Normocephalic and atraumatic.  Right Ear: External ear normal.  Left Ear: External ear normal.  Eyes: Conjunctivae are normal. No scleral icterus.  Neck: Normal range of motion. Neck supple. No tracheal  deviation present. No thyromegaly present.  Cardiovascular: Normal rate and normal heart sounds.   Pulmonary/Chest: Effort normal and breath sounds normal. No stridor. No respiratory distress. She has no wheezes.  Abdominal: Soft. She exhibits no distension. There is no tenderness. There is no rebound and no guarding.    Musculoskeletal: She exhibits no edema and no tenderness.  Lymphadenopathy:    She has no cervical adenopathy.  Neurological: She is alert and oriented to person, place, and time. She exhibits normal muscle tone.  Skin: Skin is warm and dry. No rash noted. She is not diaphoretic. No erythema.  Psychiatric: She has a normal mood and affect. Her behavior is normal. Judgment and thought content normal.    Data Reviewed My office note 5/8 Labs - chol 208, LDL 149; o/w eval labs ok UGI - small hiatal hernia U/s - normal Sleep study - +OSA  Assessment    Morbid obesity BMI 42.3 OSA on CPAP Small hiatal hernia Dyslipidemia Asthma Joint pain     Plan    We reviewed her preoperative workup. We discussed the findings of a small hiatal hernia on her upper GI. Currently she has no symptoms of GERD. However she does have asthma. I told her we would test for a hiatal hernia during surgery. If we found that she had a significant hernia during surgery we would do a hiatal hernia repair during surgery. We reviewed her preoperative labs and discussed her elevated cholesterol and LDL lipids. She was told to bring her CPAP mask with her to the hospital.  We discussed her weight gain since her initial visit. Her weight during her initial visit was 208 pounds. Today is 216.8. We discussed the importance of weight loss during her preoperative diet plan in order to help shrink her liver. We discussed the importance of routine diet and exercise after surgery in order for her to achieve her goals. All of her questions were asked and answered.  Katherine Bridgeforth Henry. Shyla Gayheart, MD, FACS General, Bariatric,  & Minimally Invasive Surgery [facility]         Katherine Henry 10/08/2012, 11:32 AM    

## 2012-10-18 NOTE — Op Note (Signed)
Katherine Henry [birth year] [id number] 10/18/2012  Laparoscopic Adjustable Gastric Band Placement and Hiatal hernia repair Operative Note  Pre-operative Diagnosis:  Morbid obesity BMI 41.3  OSA on CPAP  Small hiatal hernia  Dyslipidemia  Asthma  Joint pain   Post-operative Diagnosis: same  Surgeon: Atilano Ina   Assistants: Luretha Murphy, MD  Anesthesia: General endotracheal anesthesia and Local anesthesia 0.25.% bupivacaine, with epinephrine  ASA Class: 3  Anesthesia: General plus local  Indications: Morbid Obesity unresponsive to medical treatment.  Findings: Hiatal hernia, repaired with 1 suture; AP-Standard LapBand   Procedure Details  The patient was seen in the Holding Room. The risks, benefits, complications, treatment options, and expected outcomes were discussed with the patient. The possibilities of reaction to medication, pulmonary aspiration, perforation of viscus, bleeding, recurrent infection, the need for additional procedures, failure to diagnose a condition, and creating a complication requiring transfusion or operation were discussed with the patient. The patient concurred with the proposed plan, giving informed consent.   The patient was taken to Operating Room # 1, identified as Katherine Henry and the procedure verified as Laparoscopic Adjustable Gastric Band Placement and possible hiatal hernia repair. A Time Out was held and the above information confirmed.  Full general anesthesia was induced with orotracheal intubation.  The patient was prepped and draped in a supine position. Appropriate antibiotics were given intravenously.  A 1cm incision was made 2 fingerbreadths below the left subcostal margin . Opitview technique was used to gain entry to the abdominal cavity. A 5 mm blunt trocar was advanced under direct vision through the abdominal wall with a 0 degree scope. The abdomen was insufflated, the laparoscope introduced.  There were no untoward findings on  diagnostic laparoscopy. Trocars were placed under direct vision in the following fashion: a 5mm trocar in the lateral right upper quadrant, a 15mm trocar in the right upper quadrant, a 5mm trocar in the high epigastrium, and one 5mm trocar slightly above and to the left of the umbilicus. The patient was placed in reverse trendelenburg position.  The [facility] liver retractor was then placed through the high epigastric trocar site and positioned to hold the liver.   The angle of His was identified and the left crus was dissected free. A calibration tube was passed by the CRNA into the oropharynx down the esophagus into the stomach. The patient's preoperative UGI had shown a small sliding hiatal hernia. 10 cc of air was instilled into the calibration balloon. The calibration tube was then pulled back and easily slid past the GE junction into the esophagus. Air was deflated from the balloon and the calibration tubing was repositioned into the mid esophagus. At this point using Kitners I identified the junction of the left and right crus Inferiorly. There was a gap between the esophagus and in the junction of the left and right crus. Using a 0 Ethibond Endo Stitch I reapproximated the left and right crus. The calibration tubing was then advanced back down into the stomach and 10 cc of air was reinflated into the balloon. The calibration tubing was then gently pulled back toward the GE junction. There was resistance and the balloon did not slide back up into the esophagus. The air was withdrawn from the calibration tubing and it was repositioned into the mid esophagus.    Approximately 8cm below the angle of His on the lesser curvature, passing through the pars flaccida and preserving the vagus nerve, a blunt instrument was gently passed anterior to the right  crus and behind the gastro-esophageal junction without difficulty. Care was taken to minimize posterior dissection in order to prevent a posterior slip.   An  AP-S Lap Band Had been introduced into the abdominal cavity through the 15mm trocar. The band was carried around the gastro-esophageal junction and locked onto itself Over the calibration tubing. The calibration tubing was then withdrawn and discarded. Three interrupted 2.0 Ethibond sutures (each secured with a titanium tie knot) were used to imbricate the anterior stomach to itself over the band to prevent anterior slippage.   The bowel was examined and there were no obvious lesions. Hemostasis was verified. The liver retractor was removed under direct visualization. There was no evidence of liver injury. The tubing from the band was brought out via the right upper quadrant 15mm trocar site. All trocars were then removed under direct vision. The skin incision was lengthened and a subcutaneous space was made to accommodate the port. A 1 inch square of vicryl mesh was anchored to the base of the port with 4 sutures. The port was attached to the tubing and then placed in the subcutaneous pocket.  The redundant tubing was advanced back into the abdominal cavity. Inverted interrupted deep dermal sutures using a 2-0 vicryl were placed.   The wounds were heavily irrigated. The skin incisions were closed with 4-0 monocryl. Dermabond was applied.   Instrument, sponge, and needle counts were correct prior to wound closure and at the conclusion of the case.          Complications:  None; patient tolerated the procedure well.                Condition: stable  Katherine Henry. Katherine Campanile, MD, FACS General, Bariatric, & Minimally Invasive Surgery [facility], [place]

## 2012-10-18 NOTE — Anesthesia Preprocedure Evaluation (Signed)
Anesthesia Evaluation  Patient identified by MRN, date of birth, ID band Patient awake    Reviewed: Allergy & Precautions, H&P , NPO status , Patient's Chart, lab work & pertinent test results  Airway Mallampati: II TM Distance: >3 FB Neck ROM: Full    Dental no notable dental hx.    Pulmonary asthma , sleep apnea and Continuous Positive Airway Pressure Ventilation ,  breath sounds clear to auscultation  Pulmonary exam normal       Cardiovascular Exercise Tolerance: Good negative cardio ROS  Rhythm:Regular Rate:Normal     Neuro/Psych negative neurological ROS  negative psych ROS   GI/Hepatic Neg liver ROS, hiatal hernia, GERD-  Medicated,  Endo/Other  Morbid obesity  Renal/GU negative Renal ROS  negative genitourinary   Musculoskeletal negative musculoskeletal ROS (+)   Abdominal (+) + obese,   Peds negative pediatric ROS (+)  Hematology negative hematology ROS (+)   Anesthesia Other Findings   Reproductive/Obstetrics negative OB ROS                           Anesthesia Physical Anesthesia Plan  ASA: III  Anesthesia Plan: General   Post-op Pain Management:    Induction: Intravenous  Airway Management Planned: Oral ETT  Additional Equipment:   Intra-op Plan:   Post-operative Plan: Extubation in OR  Informed Consent: I have reviewed the patients History and Physical, chart, labs and discussed the procedure including the risks, benefits and alternatives for the proposed anesthesia with the patient or authorized representative who has indicated his/her understanding and acceptance.   Dental advisory given  Plan Discussed with: CRNA  Anesthesia Plan Comments:         Anesthesia Quick Evaluation

## 2012-10-19 ENCOUNTER — Ambulatory Visit (HOSPITAL_COMMUNITY): Payer: BC Managed Care – PPO

## 2012-10-19 ENCOUNTER — Encounter (HOSPITAL_COMMUNITY): Payer: Self-pay | Admitting: General Surgery

## 2012-10-19 DIAGNOSIS — Z9884 Bariatric surgery status: Secondary | ICD-10-CM

## 2012-10-19 DIAGNOSIS — E785 Hyperlipidemia, unspecified: Secondary | ICD-10-CM | POA: Diagnosis present

## 2012-10-19 MED ORDER — CHLORHEXIDINE GLUCONATE 0.12 % MT SOLN
15.0000 mL | Freq: Two times a day (BID) | OROMUCOSAL | Status: DC
  Administered 2012-10-19: 15 mL via OROMUCOSAL
  Filled 2012-10-19 (×3): qty 15

## 2012-10-19 MED ORDER — BIOTENE DRY MOUTH MT LIQD: 15.0000 mL | Freq: Two times a day (BID) | OROMUCOSAL | Status: DC

## 2012-10-19 MED ORDER — OXYCODONE-ACETAMINOPHEN 5-325 MG/5ML PO SOLN: 5.0000 mL | ORAL | Status: DC | PRN

## 2012-10-19 NOTE — Progress Notes (Signed)
Patient is alert and oriented.  Pain is controlled, and patient is tolerating fluids. Advanced to protein shake today.  Reviewed Adjustable gastric band discharge instructions with patient, patient able to articulate understanding.     ADJUSTABLE GASTRIC BAND  Home Care Instructions  These instructions are to help you care for yourself when you go home.  Call: If you have any problems.   Call [phone] and ask for the surgeon on call   If you need immediate assistance come to the ER at [facility]. Tell the ER staff that you are a new post-op gastric banding patient   Signs and symptoms to report:   Severe vomiting or nausea o If you cannot handle clear liquids for longer than 1 day, call your surgeon    Abdominal pain which does not get better after taking your pain medication   Fever greater than 100.4 F and chills   Heart rate over 100 beats a minute   Trouble breathing   Chest pain    Redness, swelling, drainage, or foul odor at incision (surgical) sites    If your incisions open or pull apart   Swelling or pain in calf (lower leg)   Diarrhea (Loose bowel movements that happen often), frequent watery, uncontrolled bowel movements   Constipation, (no bowel movements for 3 days) if this happens:  o Take Milk of Magnesia, 2 tablespoons by mouth, 3 times a day for 2 days if needed o Stop taking Milk of Magnesia once you have had a bowel movement o Call your doctor if constipation continues Or o Take Miralax  (instead of Milk of Magnesia) following the label instructions o Stop taking Miralax once you have had a bowel movement o Call your doctor if constipation continues   Anything you think is "abnormal for you"   Normal side effects after surgery:   Unable to sleep at night or unable to concentrate   Irritability   Being tearful (crying) or depressed These are common complaints, possibly related to your anesthesia, stress of surgery, and change in lifestyle, that usually go  away a few weeks after surgery.  If these feelings continue, call your medical doctor.   Wound Care: You may have surgical glue, steri-strips, or staples over your incisions after surgery   Surgical glue:  Looks like a clear film over your incisions and will wear off a little at a time   Steri-strips : Adhesive strips of tape over your incisions. You may notice a yellowish color on the skin under the steri-strips. This is used to make the   steri-strips stick better. Do not pull the steri-strips off - let them fall off   Staples: Staples may be removed before you leave the hospital o If you go home with staples, call [facility] at for an appointment with your surgeon's nurse to have staples removed 10 days after surgery, [phone]   Showering: You may shower two (2) days after your surgery unless your surgeon tells you differently o Wash gently around incisions with warm soapy water, rinse well, and gently pat dry  o If you have a drain (tube from your incision), you may need someone to hold this while you shower  o No tub baths until staples are removed and incisions are healed     Medications:   Medications should be liquid or crushed if larger than the size of a dime   Extended release pills (medication that releases a little bit at a time  through the day) should not be crushed   Depending on the size and number of medications you take, you may need to space (take a few throughout the day)/change the time you take your medications so that you do not over-fill your pouch (smaller stomach)   Make sure you follow-up with your primary care physician to make medication changes needed during rapid weight loss and life-style changes   If you have diabetes, follow up with the doctor that orders your diabetes medication(s) within one week after surgery and check your blood sugar regularly.   Do not drive while taking narcotics (pain medications)   Do not take acetaminophen (Tylenol)  and Roxicet or Lortab Elixir at the same time since these pain medications contain acetaminophen  Diet:                    First 2 Weeks  You will see the nutritionist about two (2) weeks after your surgery. The nutritionist will increase the types of foods you can eat if you are handling liquids well:   If you have severe vomiting or nausea and cannot handle clear liquids lasting longer than 1 day, call your surgeon  For Same Day Surgery Discharge Patients:    The day of surgery drink water only: 2 ounces every 4 hours   If you are handling water, start drinking your high protein shake the next morning For Overnight Stay Patients:    Begin by drinking 2 ounces of a high protein every 3 hours, 5 - 6 times per day   Slowly increase the amount you drink as tolerated   You may find it easier to slowly sip shakes throughout the day.  It is important to get your proteins in first Protein Shake   Drink at least 2 ounces of shake 5-6 times per day   Each serving of protein shakes (usually 8 - 12 ounces) should have a minimum of:  o 15 grams of protein  o And no more than 5 grams of carbohydrate    Goal for protein each day: o Men = 80 grams per day o Women = 60 grams per day   Protein powder may be added to fluids such as non-fat milk or Lactaid milk or Soy milk (limit to 35 grams added protein powder per serving)  Hydration   Slowly increase the amount of water and other clear liquids as tolerated (See Acceptable Fluids)   Slowly increase the amount of protein shake as tolerated     Sip fluids slowly and throughout the day   May use sugar substitutes in small amounts (no more than 6 - 8 packets per day; i.e. Splenda)  Fluid Goal   The first goal is to drink at least 8 ounces of protein shake/drink per day (or as directed by the nutritionist); some examples of protein shakes are [employer], [employer]illard[employer], EAS Edge HP, and Unjury. See handout from pre-op Bariatric Education  Class: o Slowly increase the amount of protein shake you drink as tolerated o You may find it easier to slowly sip shakes throughout the day o It is important to get your proteins in first   Your fluid goal is to drink 64 - 100 ounces of fluid daily o It may take a few weeks to build up to this   32 oz (or more) should be clear liquids  And    32 oz (or more) should be full liquids (see below for examples)  Liquids should not contain sugar, caffeine, or carbonation  Clear Liquids:   Water or Sugar-free flavored water (i.e. Fruit H2O, Propel)   Decaffeinated coffee or tea (sugar-free)   Crystal Lite, Wyler's Lite, Minute Maid Lite   Sugar-free Jell-O   Bouillon or broth   Sugar-free Popsicle:   *Less than 20 calories each; Limit 1 per day            Full Liquids: Protein Shakes/Drinks + 2 choices per day of other full liquids   Full liquids must be: o No More Than 12 grams of Carbs per serving  o No More Than 3 grams of Fat per serving   Strained low-fat cream soup   Non-Fat milk   Fat-free Lactaid Milk   Sugar-free yogurt (Dannon Lite & Fit, Greek yogurt)   Vitamins and Minerals   Start 1 day after surgery unless otherwise directed by your surgeon   1 Chewable Multivitamin / Multimineral Supplement with iron (i.e. Centrum for Adults)   Chewable Calcium Citrate with Vitamin D-3 (Example: 3 Chewable Calcium Plus 600 with Vitamin D-3) o Take 500 mg three (3) times a day for a total of 1500 mg each day o Do not take all 3 doses of calcium at one time as it may cause constipation, and you can only absorb 500 mg  at a time  o Do not mix multivitamins containing iron with calcium supplements; take 2 hours apart o Do not substitute Tums (calcium carbonate) for your calcium   Menstruating women and those at risk for anemia (a blood disease that causes weakness) may need extra iron o Talk with your doctor to see if you need more iron   If you need extra iron: Total daily Iron  recommendation (including Vitamins) is 50 to 100 mg Iron/day   Do not stop taking or change any vitamins or minerals until you talk to your nutritionist or surgeon   Your nutritionist and/or surgeon must approve all vitamin and mineral supplements  Activity and Exercise: It is important to continue walking at home.  Limit your physical activity as instructed by your doctor.  During this time, use these guidelines:   Do not lift anything greater than ten (10) pounds for at least two (2) weeks   Do not go back to work or drive until Designer, industrial/product says you can   You may have sex when you feel comfortable  o It is VERY important for female patients to use a reliable birth control method; fertility often increases after surgery  o Do not get pregnant for at least 18 months   Start exercising as soon as your doctor tells you that you can o Make sure your doctor approves any physical activity   Start with a simple walking program   Walk 5-15 minutes each day, 7 days per week.    Slowly increase until you are walking 30-45 minutes per day   Consider joining our BELT program. [phone] or email [email or web address]   Special Instructions Things to remember:   Free counseling is available for you and your family through collaboration between [facility] and [place]. Please call [phone] and leave a message   Use your CPAP when sleeping if this applies to you    Consider buying a medical alert bracelet that says you had lap-band surgery    You will likely have your first fill (fluid added to your band) 6 - 8 weeks after surgery   [facility] has a  free Bariatric Surgery Support Group that meets monthly, the 3rd Thursday, 6 pm, [facility] Classrooms You can see classes online at [email or web address]   It is very important to keep all follow up appointments with your surgeon, nutritionist, primary care physician, and behavioral health practitioner o After the first year,  please follow up with your bariatric surgeon and nutritionist at least once a year in order to maintain best weight loss results [facility]: [phone] [facility]: [phone] Bariatric Nurse Coordinator: [phone]

## 2012-10-19 NOTE — Discharge Summary (Signed)
Physician Discharge Summary  Katherine Henry WGN:[id number] DOB: 1961-02-03 DOA: 10/18/2012  PCP: Orene Desanctis, MD  Admit date: 10/18/2012 Discharge date: 10/19/2012  Recommendations for Outpatient Follow-up:   Follow-up Information   Follow up with Atilano Ina, MD On 11/03/2012. (11:30 AM)    Contact information:   [address] [place] [address] [phone]      Discharge Diagnoses:  Patient Active Problem List   Diagnosis Date Noted  . H/O laparoscopic adjustable gastric banding 10/18/12 10/19/2012  . Dyslipidemia 10/19/2012  . OSA on CPAP 10/08/2012  . Obesity, Class III, BMI 40-49.9 (morbid obesity) 07/22/2012  . Unspecified asthma(493.90) 07/22/2012    Surgical Procedure: Laparoscopic adjustable gastric band placement and hiatal hernia repair 10/18/2012  Discharge Condition: Good Disposition: home  Diet recommendation: Postop lap band discharge diet  Filed Weights   10/18/12 0703  Weight: 211 lb (95.709 kg)     Hospital Course:  Patient was admitted for planned laparoscopic adjustable gastric band placement and possible hiatal hernia repair. During surgery she was found to have a defect at her hiatus which was repaired. Postoperatively as well as perioperatively she was maintained on sequential compression devices as well as chemical DVT prophylaxis. On postoperative day 0 she was set up for CPAP at night. On postop day 1 she was tolerating her diet. Her vital signs were stable. Her pain was controlled. She had voided without difficulty. She was deemed stable for discharge. She received discharge instructions  BP 102/65  Pulse 74  Temp(Src) 97.9 F (36.6 C) (Oral)  Resp 16  Ht 5' (1.524 m)  Wt 211 lb (95.709 kg)  BMI 41.21 kg/m2  SpO2 97%  Gen: alert, NAD, non-toxic appearing HEENT: normocephalic, atraumatic; pupils equal, no scleral icterus,  Pulm: Lungs clear to auscultation, symmetric chest rise CV: regular rate and rhythm Abd: soft,  obese, nondistended. Incisions c/d/i. Marland Kitchen Port is in right mid-abdomen Ext: no edema, normal, symmetric strength Neuro: nonfocal,  Psych: appropriate, judgment normal   Discharge Instructions   Future Appointments Provider Department Dept Phone   11/02/2012 4:00 PM [id number] Post-Op Class [facility] [phone]   11/03/2012 11:30 AM Atilano Ina, MD [facility], [place] [phone]       Medication List         albuterol 108 (90 BASE) MCG/ACT inhaler  Commonly known as:  PROVENTIL HFA;VENTOLIN HFA  Inhale 2 puffs into the lungs every 6 (six) hours as needed for wheezing.     albuterol (5 MG/ML) 0.5% Nebu  Commonly known as:  PROVENTIL, VENTOLIN  Take 1 mL by nebulization 2 (two) times daily.     calcium carbonate 600 MG Tabs tablet  Commonly known as:  OS-CAL  Take 600 mg by mouth 2 (two) times daily with a meal.     docusate sodium 100 MG capsule  Commonly known as:  COLACE  Take 100 mg by mouth daily as needed for constipation.     DULERA 200-5 MCG/ACT Aero  Generic drug:  mometasone-formoterol  Inhale 2 puffs into the lungs 2 (two) times daily.     DYMISTA 137-50 MCG/ACT Susp  Generic drug:  Azelastine-Fluticasone  Place 1 spray into the nose 2 (two) times daily.     EPINEPHrine 0.1 MG/ML injection  Commonly known as:  ADRENALIN  Inject 0.3 mg into the vein once.     ergocalciferol 50000 UNITS capsule  Commonly known as:  VITAMIN D2  Take 50,000 Units by  mouth every Sunday.     famotidine 20 MG tablet  Commonly known as:  PEPCID  Take 20 mg by mouth every morning.     loratadine 10 MG tablet  Commonly known as:  CLARITIN  Take 10 mg by mouth at bedtime.     montelukast 10 MG tablet  Commonly known as:  SINGULAIR  Take 10 mg by mouth every morning.     multivitamin with minerals Tabs tablet  Take 1 tablet by mouth daily.     oxyCODONE-acetaminophen 5-325 MG/5ML solution  Commonly known as:  ROXICET   Take 5-10 mLs by mouth every 4 (four) hours as needed.     polyethylene glycol packet  Commonly known as:  MIRALAX / GLYCOLAX  Take 17 g by mouth daily.     PRESCRIPTION MEDICATION  ALLERGY INJECTIONS WEEKLY-- SUNDAY     vitamin B-12 1000 MCG tablet  Commonly known as:  CYANOCOBALAMIN  Take 1,000 mcg by mouth daily.     zolpidem 10 MG tablet  Commonly known as:  AMBIEN  Take 10 mg by mouth at bedtime as needed for sleep.           Follow-up Information   Follow up with Atilano Ina, MD On 11/03/2012. (11:30 AM)    Contact information:   [address] [place] [address] [phone]        The results of significant diagnostics from this hospitalization (including imaging, microbiology, ancillary and laboratory) are listed below for reference.    Significant Diagnostic Studies: Dg Abd 1 View  10/19/2012   *RADIOLOGY REPORT*  Clinical Data: Post laparoscopic banding  ABDOMEN - 1 VIEW  Comparison: 08/30/2012  Findings: Laparoscopic band identified at the proximal stomach in an 8 o'clock to 2 o'clock orientation. Tubing extends to the reservoir located at the lateral right mid abdomen. Normal bowel gas pattern. Lung bases clear. Bones unremarkable.  IMPRESSION: Laparoscopic band as above.   Original Report Authenticated By: Ulyses Southward, M.D.    Labs: Basic Metabolic Panel:  Recent Labs Lab 10/13/12 1135  NA 138  K 4.0  CL 102  CO2 26  GLUCOSE 85  BUN 17  CREATININE 0.61  CALCIUM 9.2   Liver Function Tests:  Recent Labs Lab 10/13/12 1135  AST 34  ALT 66*  ALKPHOS 113  BILITOT 0.2*  PROT 6.8  ALBUMIN 3.5    CBC:  Recent Labs Lab 10/13/12 1135  WBC 7.7  NEUTROABS 5.4  HGB 14.0  HCT 41.3  MCV 88.2  PLT 326    CBG: No results found for this basename: GLUCAP,  in the last 168 hours  Active Problems:   Obesity, Class III, BMI 40-49.9 (morbid obesity)   Unspecified asthma(493.90)   OSA on CPAP   H/O laparoscopic adjustable  gastric banding 10/18/12   Dyslipidemia   Time coordinating discharge: 10 minutes  Signed:  Atilano Ina, MD [facility] [place] [facility], [place] [phone] 10/19/2012, 10:48 AM

## 2012-10-23 NOTE — Progress Notes (Addendum)
Bariatric Class:  Appt start time: 0830 end time:  0930.  Pre-Operative Nutrition Class  Patient was seen on 10/07/12 for Pre-Operative Bariatric Surgery [facility].   Surgery date: 10/18/12 Surgery type: LAGB Start weight at [facility]: 207.9 lbs (08/24/12) Goal weight: 140-145 lbs  Weight today: 212.5 lbs Weight change: 4.6 lbs GAIN Total weight lost: n/a  TANITA  BODY COMP RESULTS  10/07/12   BMI (kg/m^2) 41.5   Fat Mass (lbs) 103.5   Fat Free Mass (lbs) 109.0   Total Body Water (lbs) 80.0   Samples given per MNT protocol; Patient educated on appropriate usage: Bariatric Advantage Multivitamin Lot # [id number] Exp: 06/15  Bariatric Advantage Calcium Citrate Lot # [id number] Exp: 03/15  Bariatric Advantage B12 Lot # [id number] Exp: 10/15  Celebrate Vitamins Multivitamin Lot # [id number] Exp: 07/15  Celebrate Vitamins Calcium Citrate Lot # [id number] Exp: 09/15  Corliss Marcus Protein Powder Lot # [id number] Exp: 09/15  The following the learning objectives met by the patient during this course:  Identify Pre-Op Dietary Goals and will begin 2 weeks pre-operatively  Identify appropriate sources of fluids and proteins   State protein recommendations and appropriate sources pre and post-operatively  Identify Post-Operative Dietary Goals and will follow for 2 weeks post-operatively  Identify appropriate multivitamin and calcium sources  Describe the need for physical activity post-operatively and will follow MD recommendations  State when to call healthcare provider regarding medication questions or post-operative complications  Handouts given during class include:  Pre-Op Bariatric Surgery Diet Handout  Protein Shake Handout  Post-Op Bariatric Surgery Nutrition Handout  BELT Program Information Flyer  Support Group Information Flyer  [facility] Bariatric Supplements Price List  Follow-Up Plan: Patient will follow-up at [facility] 2  weeks post operatively for diet advancement per MD.

## 2012-10-23 NOTE — Patient Instructions (Signed)
Follow:   Pre-Op Diet per MD 2 weeks prior to surgery  Phase 2- Liquids (clear/full) 2 weeks after surgery  Vitamin/Mineral/Calcium guidelines for purchasing bariatric supplements  Exercise guidelines pre and post-op per MD  Follow-up at [facility] in 2 weeks post-op for diet advancement. Contact Murl Golladay as needed with questions/concerns. 

## 2012-10-26 ENCOUNTER — Encounter (INDEPENDENT_AMBULATORY_CARE_PROVIDER_SITE_OTHER): Payer: Self-pay | Admitting: General Surgery

## 2012-10-26 ENCOUNTER — Telehealth (INDEPENDENT_AMBULATORY_CARE_PROVIDER_SITE_OTHER): Payer: Self-pay | Admitting: *Deleted

## 2012-10-26 ENCOUNTER — Other Ambulatory Visit (INDEPENDENT_AMBULATORY_CARE_PROVIDER_SITE_OTHER): Payer: Self-pay | Admitting: *Deleted

## 2012-10-26 MED ORDER — ONDANSETRON 4 MG PO TBDP: 4.0000 mg | ORAL_TABLET | Freq: Four times a day (QID) | ORAL | Status: DC | PRN

## 2012-10-26 NOTE — Telephone Encounter (Signed)
Called and spoke with patient regarding her email that she sent.  Patient reports that she is having some nausea when drinking her protein shakes.  Patient has spoken with Huntley Dec (nutritionist) and received some suggestions including switching to a non-dairy based shake.  Patient is going to try the suggestions however Huntley Dec had suggested her to call and get something for nausea to try to help as well.  Patient denies any fevers, drainage, redness, or swelling at the incision site.  Patient states localized pain at the incision site but nothing that seems abnormal.  Patient denies any difficulty with urination or BMs.  At this time it feels appropriate to call in per protocol Zofran 4mg  ODT Take 1 tablet every 6 hours as needed for nausea #15 no refills generic is allowed.  This prescription has been called into [facility] in [place] [phone] per patients request.  Patient states understanding with plan and agreeable at this time.  Patient states understanding to call back if she has continued problems with nausea.

## 2012-10-29 ENCOUNTER — Encounter (INDEPENDENT_AMBULATORY_CARE_PROVIDER_SITE_OTHER): Payer: Self-pay | Admitting: General Surgery

## 2012-11-02 ENCOUNTER — Encounter: Payer: BC Managed Care – PPO | Attending: General Surgery | Admitting: *Deleted

## 2012-11-02 VITALS — Ht 60.0 in | Wt 204.5 lb

## 2012-11-02 DIAGNOSIS — Z713 Dietary counseling and surveillance: Secondary | ICD-10-CM | POA: Insufficient documentation

## 2012-11-02 DIAGNOSIS — Z01818 Encounter for other preprocedural examination: Secondary | ICD-10-CM | POA: Insufficient documentation

## 2012-11-03 ENCOUNTER — Encounter (INDEPENDENT_AMBULATORY_CARE_PROVIDER_SITE_OTHER): Payer: Self-pay | Admitting: General Surgery

## 2012-11-03 ENCOUNTER — Ambulatory Visit (INDEPENDENT_AMBULATORY_CARE_PROVIDER_SITE_OTHER): Payer: BC Managed Care – PPO | Admitting: General Surgery

## 2012-11-03 VITALS — BP 96/66 | HR 76 | Temp 97.0°F | Resp 16 | Ht 60.0 in | Wt 204.2 lb

## 2012-11-03 DIAGNOSIS — Z9884 Bariatric surgery status: Secondary | ICD-10-CM

## 2012-11-03 NOTE — Patient Instructions (Addendum)
Eating techniques 20-20-20 (30-30-30) 20 chews, 20 seconds between bites of food, 20 minutes to eat; sometimes you may need 30 chews, 30 seconds etc Use your nondominant hand to eat with Use a child/infant size utensil Try not to eat while watching TV  Walk daily Take the reflux medication daily Take miralax as needed  PLEASE EMAIL ME TO LET ME KNOW YOU RECEIVED THIS INSTRUCTIONS.

## 2012-11-03 NOTE — Progress Notes (Signed)
Subjective:     Patient ID: Katherine Henry, female   DOB: [birth year], 52 y.o.   MRN: [id number]  HPI 52 year old Caucasian female comes in for her first postoperative appointment. She underwent laparoscopic adjustable gastric band placement and hiatal hernia repair on August 4. She was kept overnight for observation because of her obstructive sleep apnea. She states that she has done well. She has had some persistent nausea since surgery. The nausea only occurs when she tries to drink her protein shakes. She did try different types of protein shakes but the nausea has persisted. She does not have the nausea with other liquids. Since taking an OTC reflux medication she states her nausea has improved. She has had some constipation and has taken MiraLAX which has helped. She denies any abdominal pain. She denies any incisional problems. She has been walking several times a week for about 1 mile at a time.She denies any regurgitation or emesis. Her diet was just advanced to solid proteins yesterday. She states that she had no difficulty with eggs and cheese.   Review of Systems     Objective:   Physical Exam BP 96/66  Pulse 76  Temp(Src) 97 F (36.1 C) (Temporal)  Resp 16  Ht 5' (1.524 m)  Wt 204 lb 3.2 oz (92.625 kg)  BMI 39.88 kg/m2  LMP 10/14/1998  See LapBand flowsheet  Gen: alert, NAD, non-toxic appearing HEENT: normocephalic, atraumatic; pupils equal, no scleral icterus, neck supple, no lymphadenopathy Pulm: Lungs clear to auscultation, symmetric chest rise CV: regular rate and rhythm Abd: soft, nontender, nondistended. Well-healed trocar sites. No incisional hernia. Port is in right mid-abdomen Ext: no edema, normal, symmetric strength Neuro: nonfocal,  Psych: appropriate, judgment normal     Assessment:     S/p LAGB and HHR      Plan:     Overall she is doing well. Her weight loss is 12.6 pounds.  I have released her to full duties and activities. We discussed the importance  of routine daily exercise. Told her it is okay to continue to use the Murine as needed. I also encouraged her to continue to use her reflux medication daily. My guess is her nausea is probably due to reflux.    We discussed proper eating behaviors. We discussed the importance of taking a small bite, chewing 20-30 times, waiting 20-30 seconds between bites of food and taking approximately 20-30 minutes to eat a meal. We discussed strategies to help with this such as using her nondominant hand to eat with as well as using small utensils.  F/u 2 weeks for first adjustment.   [name]. Andrey Campanile, MD, FACS General, Bariatric, & Minimally Invasive Surgery [facility] Surgery, [place]

## 2012-11-18 ENCOUNTER — Ambulatory Visit (INDEPENDENT_AMBULATORY_CARE_PROVIDER_SITE_OTHER): Payer: BC Managed Care – PPO | Admitting: General Surgery

## 2012-11-18 ENCOUNTER — Encounter (INDEPENDENT_AMBULATORY_CARE_PROVIDER_SITE_OTHER): Payer: Self-pay | Admitting: General Surgery

## 2012-11-18 VITALS — BP 126/64 | HR 80 | Temp 98.3°F | Resp 14 | Ht 60.0 in | Wt 200.6 lb

## 2012-11-18 DIAGNOSIS — Z09 Encounter for follow-up examination after completed treatment for conditions other than malignant neoplasm: Secondary | ICD-10-CM

## 2012-11-18 NOTE — Progress Notes (Signed)
Subjective:     Patient ID: Katherine Henry, female   DOB: 1960/12/21, 52 y.o.   MRN: [id number]  HPI 52 year old Caucasian female comes in for short interval followup. She underwent laparoscopic adjustable gastric band surgery with hiatal hernia repair on August 4. I last saw her in the office on August 20. She comes in today for evaluation for her first adjustment. She states that since she was last seen She has been doing fairly well. She reports 1 or 2 episodes of regurgitation; however, she states it is because she is not taking small bites. She also may not be waiting of time between bites of food. She did try a Hip-hop  Class But her plantars fasciitis flared up afterwards. She is still walking daily. She is taking her supplements. She would like to try water aerobics however the indoor chlorine interferes with her asthma Review of Systems     Objective:   Physical Exam BP 126/64  Pulse 80  Temp(Src) 98.3 F (36.8 C) (Temporal)  Resp 14  Ht 5' (1.524 m)  Wt 200 lb 9.6 oz (90.992 kg)  BMI 39.18 kg/m2  See LapBand flowsheet  Gen: alert, NAD, non-toxic appearing HEENT: normocephalic, atraumatic; pupils equal, no scleral icterus, neck supple, no lymphadenopathy Pulm: Lungs clear to auscultation, symmetric chest rise CV: regular rate and rhythm Abd: soft, nontender, nondistended. Well-healed trocar sites. No incisional hernia. Port is in right mid-abdomen Ext: no edema, normal, symmetric strength Neuro: nonfocal,  Psych: appropriate, judgment normal     Assessment:     S/p LAGB + HHR OSA on CPAP GERD Dislipidemia Joint Pain     Plan:     I congratulated her on her weight loss. She's lost an additional 3.6 pounds since her last visit just about 2 weeks ago. Her total weight loss has been approximately 16.2 pounds. We discussed the importance of routine daily exercise. We also rediscussed the importance of taking her supplements. I do think the one or 2 episodes of  regurgitation she has reported is primarily due to poor eating behaviors. We rediscussed proper eating behaviors see patient instructions sheet  I recommended an adjustment since she can still eat large portion sizes  After obtaining verbal consent, the abdominal wall was prepped with Chloraprep. The port was accessed with a Huber needle and 1.5 cc of saline was added to give the patient an expected fill volume of  cc.  The patient was able to tolerate sips of water.  She was instructed to stay on liquids for at least 24 hours preferably 48 hours. She was instructed on what to call for. Followup 4-6 weeks  [name]. Andrey Campanile, MD, FACS General, Bariatric, & Minimally Invasive Surgery [facility] Surgery, [place]

## 2012-11-18 NOTE — Patient Instructions (Addendum)
Eating techniques 20-20-20 (30-30-30) 20 chews, 20 seconds between bites of food, 20 minutes to eat; sometimes you may need 30 chews, 30 seconds etc Use your nondominant hand to eat with Use a child/infant size utensil Try not to eat while watching TV  1. Stay on liquids for the next 1-2 days as you adapt to your new fill volume.  Then resume your previous diet. 2. Decreasing your carbohydrate intake will hasten you weight loss.  Rely more on proteins for your meals.  Avoid condiments that contain sweets such as Honey Mustard and sugary salad dressings.   3. Stay in the "green zone".  If you are regurgitating with meals, having night time reflux, and find yourself eating soft comfort foods (mashed potatoes, potato chips)...realize that you are developing "maladaptive eating".  You will not lose weight this way and may regain weight.  The GREEN ZONE is eating smaller portions and not regurgitating.  Hence we may need to withdraw fluid from your band. 4. Build exercise into your daily routine.  Walking is the best way to start but do something every day if you can.    Please email me to let me know you received these instructions.

## 2012-12-06 ENCOUNTER — Encounter: Payer: BC Managed Care – PPO | Attending: General Surgery | Admitting: *Deleted

## 2012-12-06 ENCOUNTER — Encounter: Payer: Self-pay | Admitting: *Deleted

## 2012-12-06 VITALS — Ht 60.0 in | Wt 197.0 lb

## 2012-12-06 DIAGNOSIS — Z01818 Encounter for other preprocedural examination: Secondary | ICD-10-CM | POA: Insufficient documentation

## 2012-12-06 DIAGNOSIS — Z713 Dietary counseling and surveillance: Secondary | ICD-10-CM | POA: Insufficient documentation

## 2012-12-06 NOTE — Patient Instructions (Addendum)
Goals:  Follow Phase 3B: High Protein + Non-Starchy Vegetables  Eat 3-6 small meals/snacks, every 3-5 hrs  Increase lean protein foods to meet 60-80g goal  Increase fluid intake to 64oz +  Avoid drinking 15 minutes before, during and 30 minutes after eating  Aim for >30 min of physical activity daily   TANITA  BODY COMP RESULTS  10/07/12 11/02/12 12/06/12   BMI (kg/m^2) 41.5 39.9 38.5   Fat Mass (lbs) 103.5 100.0 89.5   Fat Free Mass (lbs) 109.0 104.5 107.5   Total Body Water (lbs) 80.0 76.5 78.5

## 2012-12-06 NOTE — Progress Notes (Signed)
Follow-up visit:  7 Weeks Post-Operative LAGB Surgery  Medical Nutrition Therapy:  Appt start time: 1500   End time: 1530.  Primary concerns today: Post-operative Bariatric Surgery Nutrition Management.  Surgery date: 10/18/12 Surgery type: LAGB Start weight at [facility]: 207.9 lbs (08/24/12) Pre-Op class weight: 212.5 lbs (10/07/12) Goal weight: 140-145 lbs  Weight today: 197.0 lbs Last weight: 204.5 lbs Weight change: - 7.5 lbs  Total weight lost: 15.5 lbs   TANITA  BODY COMP RESULTS  10/07/12 11/02/12 12/06/12   BMI (kg/m^2) 41.5 39.9 38.5   Fat Mass (lbs) 103.5 100.0 89.5   Fat Free Mass (lbs) 109.0 104.5 107.5   Total Body Water (lbs) 80.0 76.5 78.5   24-hr recall: B (AM): cottage cheese 1/4c (7g) Snk (AM): String cheese (6g) L (PM): 2-3 oz tuna, green beans (20-25g) Snk (PM): [place] yogurt Dannon L&F (12g)  D (PM): Seafood, zucchini/squash (20g) Snk (PM): None  Fluid intake:  Water 64 oz; 2 cups coffee (w/ caffeine) Estimated total protein intake: 65-70g  Medications: See med list Supplementation:  Taking MVI & Calcium (1000 mg over 2 doses)  Using straws: No Drinking while eating: No Hair loss: No Carbonated beverages: No N/V/D/C: Chronic constipation. Takes miralax/colace. [place] fries not tolerated.   Last Lap-Band fill:  11/18/12 - 1.5 cc added. Feels in the green/yellow zone.  Recent physical activity:  Yoga (1x/wk @ 60 min), walking (2 miles daily), water aerobics (1x/wk @ 60 min)   Progress Towards Goal(s):  In progress.  Handouts given during visit include:  Phase 3B: High Protein + Non-Starchy   Nutritional Diagnosis:  [place]-3.3 Overweight/obesity related to past poor dietary habits and physical inactivity as evidenced by patient w/ recent LAGB surgery following dietary guidelines for continued weight loss.    Intervention:  Nutrition education/diet advancement.  Monitoring/Evaluation:  Dietary intake, exercise, lap band fills, and body weight. Follow up in 6  weeks for 3 month post-op visit.

## 2012-12-23 ENCOUNTER — Ambulatory Visit (INDEPENDENT_AMBULATORY_CARE_PROVIDER_SITE_OTHER): Payer: BC Managed Care – PPO | Admitting: General Surgery

## 2012-12-23 ENCOUNTER — Encounter (INDEPENDENT_AMBULATORY_CARE_PROVIDER_SITE_OTHER): Payer: Self-pay | Admitting: General Surgery

## 2012-12-23 VITALS — BP 118/80 | HR 68 | Temp 97.7°F | Resp 16 | Ht 60.0 in | Wt 194.4 lb

## 2012-12-23 DIAGNOSIS — Z09 Encounter for follow-up examination after completed treatment for conditions other than malignant neoplasm: Secondary | ICD-10-CM

## 2012-12-23 NOTE — Patient Instructions (Signed)
1. Stay on liquids for the next 1- 2 days as you adapt to your new fill volume.  Then resume your previous diet. 2. Decreasing your carbohydrate intake will hasten you weight loss.  Rely more on proteins for your meals.  Avoid condiments that contain sweets such as Honey Mustard and sugary salad dressings.   3. Stay in the "green zone".  If you are regurgitating with meals, having night time reflux, and find yourself eating soft comfort foods (mashed potatoes, potato chips)...realize that you are developing "maladaptive eating".  You will not lose weight this way and may regain weight.  The GREEN ZONE is eating smaller portions and not regurgitating.  Hence we may need to withdraw fluid from your band. 4. Build exercise into your daily routine.  Walking is the best way to start but do something every day if you can.    Eating techniques 20-20-20 (30-30-30) 20 chews, 20 seconds between bites of food, 20 minutes to eat; sometimes you may need 30 chews, 30 seconds etc Use your nondominant hand to eat with Use a child/infant size utensil Try not to eat while watching TV Put down fork between bites of food  

## 2012-12-24 NOTE — Progress Notes (Signed)
Subjective:     Patient ID: Katherine Henry, female   DOB: [birth year], 52 y.o.   MRN: [id number]  HPI 52 year old female comes in for followup after undergoing Laparoscopic adjustable gastric band placement with hiatal hernia repair on August 4. I last saw her in the office on September 4. Her weight at that time was 200.9 pounds. She states that she has been doing well. She has actually increased her exercise regimen. She is doing yoga, water aerobics and walking 5 times a week. She is still taking her Pepcid however she states that her reflux is significantly better. She is getting fuller on smaller amounts of food. She still sometimes has hunger cravings. She denies any reflux or regurgitation. She denies any nighttime cough. She denies any abdominal pain. She states that she did try a french fry and it would not go down.  She had developed a cold a few days ago with runny nose and a sinus infection. Her pulmonologist went ahead and put her on an antibiotic. Review of Systems     Objective:   Physical Exam BP 118/80  Pulse 68  Temp(Src) 97.7 F (36.5 C) (Temporal)  Resp 16  Ht 5' (1.524 m)  Wt 194 lb 6.4 oz (88.179 kg)  BMI 37.97 kg/m2  See LapBand flowsheet  Gen: alert, NAD, non-toxic appearing HEENT: normocephalic, atraumatic; pupils equal, no scleral icterus,  Pulm: Lungs clear to auscultation, symmetric chest rise CV: regular rate and rhythm Abd: soft, nontender, nondistended. Well-healed trocar sites. No incisional hernia. Port is in right mid-abdomen Ext: no edema, normal, Neuro: nonfocal,  Psych: appropriate, judgment normal     Assessment:     Status post laparoscopic adjustable gastric band placement with hiatal hernia repair     Plan:     Overall I think she is doing well. I congratulated her on her weight loss. Today's weight is 194.4 pounds. Total weight loss has been around 22 pounds. I congratulated her on her weight loss. We rediscussed proper eating techniques  and behaviors. She states that she can still eat large portions of food and has hunger;, therefore, I recommended an adjustment today  After obtaining verbal consent, the abdominal wall was prepped with Chloraprep. The port was accessed with a Huber needle and 1 cc of saline was added to give the patient an expected fill volume of  cc.  The patient was able to tolerate sips of water.  She was instructed to stay on liquids for the next 24 hours. We discussed proper eatingg techniques. I encouraged her to continue with the amount of exercise that she is getting. Followup 4-6 weeks.  [name]. Andrey Campanile, MD, FACS General, Bariatric, & Minimally Invasive Surgery [facility] Surgery, [place]

## 2013-01-02 NOTE — Progress Notes (Signed)
  Bariatric Class:  Appt start time: 1600 end time:  1700.  2 Week Post-Operative Nutrition Class  Patient was seen on 11/02/12 for Post-Operative Nutrition education at [facility].   Surgery date: 10/18/12 Surgery type: LAGB Start weight at [facility]: 207.9 lbs (08/24/12) Goal weight: 140-145 lbs  Last weight: 212.5 lbs Weight today: 204.5 lbs Weight change:  8.0 lbs Total weight lost: 8.0 lbs  TANITA  BODY COMP RESULTS  10/07/12 11/02/12   BMI (kg/m^2) 41.5 39.9   Fat Mass (lbs) 103.5 100.0   Fat Free Mass (lbs) 109.0 104.5   Total Body Water (lbs) 80.0 76.5   The following the learning objective met the patient during this course:   Identifies Phase 3A (Soft, High Proteins) Dietary Goals and will begin from 2 weeks post-operatively to 2 months post-operatively  Identifies appropriate sources of fluids and proteins   States protein recommendations and appropriate sources post-operatively  Identifies the need for appropriate texture modifications, mastication, and bite sizes when consuming solids  Identifies appropriate multivitamin and calcium sources post-operatively  Describes the need for physical activity post-operatively and will follow MD recommendations  States when to call healthcare provider regarding medication questions or post-operative complications  Handouts given during class include:  Phase 3A: Soft, High Protein Diet Handout  Band Fill Guidelines Handout  Follow-Up Plan: Patient will follow-up at [facility] in 4 weeks for 6 weeks post-op nutrition visit for diet advancement per MD.

## 2013-01-02 NOTE — Patient Instructions (Addendum)
Patient to follow Phase 3A-Soft, High Protein Diet and follow-up at [facility] in 4 weeks for 6 week post-op nutrition visit for diet advancement. 

## 2013-01-03 ENCOUNTER — Encounter (INDEPENDENT_AMBULATORY_CARE_PROVIDER_SITE_OTHER): Payer: Self-pay | Admitting: Surgery

## 2013-01-03 ENCOUNTER — Encounter (INDEPENDENT_AMBULATORY_CARE_PROVIDER_SITE_OTHER): Payer: Self-pay | Admitting: General Surgery

## 2013-01-03 ENCOUNTER — Ambulatory Visit (INDEPENDENT_AMBULATORY_CARE_PROVIDER_SITE_OTHER): Payer: BC Managed Care – PPO | Admitting: Surgery

## 2013-01-03 ENCOUNTER — Telehealth (INDEPENDENT_AMBULATORY_CARE_PROVIDER_SITE_OTHER): Payer: Self-pay

## 2013-01-03 ENCOUNTER — Encounter (INDEPENDENT_AMBULATORY_CARE_PROVIDER_SITE_OTHER): Payer: BC Managed Care – PPO | Admitting: Surgery

## 2013-01-03 VITALS — BP 122/78 | HR 70 | Temp 98.1°F | Resp 14 | Ht 60.0 in | Wt 190.4 lb

## 2013-01-03 DIAGNOSIS — R1110 Vomiting, unspecified: Secondary | ICD-10-CM

## 2013-01-03 DIAGNOSIS — Z09 Encounter for follow-up examination after completed treatment for conditions other than malignant neoplasm: Secondary | ICD-10-CM

## 2013-01-03 NOTE — Telephone Encounter (Signed)
Message copied by Ethlyn Gallery on Mon Jan 03, 2013 11:48 AM ------      Message from: Andrey Campanile, ERIC M      Created: Mon Jan 03, 2013 11:31 AM      Regarding: copy of email i sent her       Ms Sigman            If you are taking one small bite at a time, chewing 20-30 times before swallowing, and waiting 20 seconds between bites of food and encountering this type of problem then we need to take a little fluid out. If you are having trouble with liquids even using this strategy then we will need to bring you in before Wednesday.  If you are able to do ok with liquids then we can wait til my Wednesday clinic to remove some fluid. But if you are having trouble with liquids then we need to take some fluid out today -no later than Tuesday. i'll have my assistant given you a call shortly.                  Ranelle Auker - pls call her and see how she is doing. See my note above            Thanks      wilson ------

## 2013-01-03 NOTE — Progress Notes (Signed)
Subjective:     Patient ID: Katherine Henry, female   DOB: 26-Feb-1961, 52 y.o.   MRN: [id number]  HPI 52 yo WF s/p LapBand placement with Hiatal Hernia repair comes in b/c intolerance to solid food starting about 3 days after her last adjustment on 10/9. She is tolerating liquids but takes a long time for it to go. Takes 1 hour to eat a boiled egg. +regurgitation. No reflux. No cough. No cp/f/c.  Review of Systems     Objective:   Physical Exam BP 122/78  Pulse 70  Temp(Src) 98.1 F (36.7 C) (Temporal)  Resp 14  Ht 5' (1.524 m)  Wt 190 lb 6.4 oz (86.365 kg)  BMI 37.19 kg/m2 Alert, nad No resp distress Soft, nt, nd. Port in rt mid abd.     Assessment:     S/p LAGB + HHR  Intolerance to solids     Plan:     Band is too tight. Needs an adjustment. Wt loss since 10/9 has been 4 pounds  After obtaining verbal consent, the abdominal wall was prepped with Chloraprep. The port was accessed with a Huber needle and 0.5 cc of saline was added to give the patient an expected fill volume of 4.7 cc.  The patient was able to tolerate sips of water.   Pt was instructed to stay on liquids for next 24 hours and then resume solids. We rediscussed proper eating techniques.   Keep previously scheduled appt with me  [name]. Andrey Campanile, MD, FACS General, Bariatric, & Minimally Invasive Surgery [facility], [place]

## 2013-01-03 NOTE — Patient Instructions (Signed)
1. Stay on liquids for the next 1- 2 days as you adapt to your new fill volume.  Then resume your previous diet. 2. Decreasing your carbohydrate intake will hasten you weight loss.  Rely more on proteins for your meals.  Avoid condiments that contain sweets such as Honey Mustard and sugary salad dressings.   3. Stay in the "green zone".  If you are regurgitating with meals, having night time reflux, and find yourself eating soft comfort foods (mashed potatoes, potato chips)...realize that you are developing "maladaptive eating".  You will not lose weight this way and may regain weight.  The GREEN ZONE is eating smaller portions and not regurgitating.  Hence we may need to withdraw fluid from your band. 4. Build exercise into your daily routine.  Walking is the best way to start but do something every day if you can.    Eating techniques 20-20-20 (30-30-30) 20 chews, 20 seconds between bites of food, 20 minutes to eat; sometimes you may need 30 chews, 30 seconds etc Use your nondominant hand to eat with Use a child/infant size utensil Try not to eat while watching TV

## 2013-01-03 NOTE — Telephone Encounter (Signed)
Called pt to check on her and see if she had seen her email back from Dr Andrey Campanile. The pt is not feeling well she is having a hard time getting water down. The pt has vomited twice already. I advised pt that she needed to be seen today by one of Dr Tawana Scale partners to get some fluid out. The pt is fine with seeing someone today. I made her an appt for 01/03/13 arrive at 3:45 to see Dr Ezzard Standing.

## 2013-01-13 ENCOUNTER — Encounter: Payer: BC Managed Care – PPO | Attending: General Surgery | Admitting: Dietician

## 2013-01-13 VITALS — Ht 60.0 in | Wt 190.5 lb

## 2013-01-13 DIAGNOSIS — Z713 Dietary counseling and surveillance: Secondary | ICD-10-CM | POA: Insufficient documentation

## 2013-01-13 DIAGNOSIS — Z01818 Encounter for other preprocedural examination: Secondary | ICD-10-CM | POA: Insufficient documentation

## 2013-01-13 NOTE — Progress Notes (Signed)
Follow-up visit:  12 Weeks Post-Operative LAGB Surgery  Medical Nutrition Therapy:  Appt start time: 1500   End time: 1530.  Primary concerns today: Post-operative Bariatric Surgery Nutrition Management. States she has a desire to "crunch" on food and has been having some almonds. Had some saline removed on 10/20 since she was not able to eat and was vomiting frequently, even liquids. Now feel likes she is able to eat too much food.   Surgery date: 10/18/12 Surgery type: LAGB Start weight at [facility]: 207.9 lbs (08/24/12) Pre-Op class weight: 212.5 lbs (10/07/12) Goal weight: 140-145 lbs % of Goal Weight: 30%  Weight today: 190.5 lbs Last weight: 197.0 lbs Weight change: 6.5 lbs LOSS Total weight lost: 17.4 lbs   TANITA  BODY COMP RESULTS  10/07/12 11/02/12 12/06/12 01/13/13   BMI (kg/m^2) 41.5 39.9 38.5 37.2   Fat Mass (lbs) 103.5 100.0 89.5 83.0   Fat Free Mass (lbs) 109.0 104.5 107.5 107.5   Total Body Water (lbs) 80.0 76.5 78.5 78.5   24-hr recall: B (AM): cottage cheese 1/4c or yogurt or egg with cheese and Canadian Bacon(7g) Snk (AM): String cheese (6g) L (PM): 2-3 oz tuna, green beans or salad (20-25g) Snk (PM): [place] yogurt Dannon L&F (12g)  D (PM): Seafood, zucchini/squash or other vegetable (20g) Snk (PM): yogurt or almonds  Fluid intake:  Water 64 oz; 2 cups black coffee (w/ caffeine) Estimated total protein intake: 65-70g  Medications: See med list Supplementation:  Taking MVI & Calcium (1000 mg over 2 doses)  Using straws: No Drinking while eating: No Hair loss: No Carbonated beverages: No, a couple of sips N/V/D/C: Vomiting resolved with removal of some fluid. Chronic constipation, better with vegetables. Takes miralax.  Last Lap-Band fill:  12/23/12 - 1.0 cc added and then removed on 10/20 d/t vomiting   Recent physical activity:  Yoga (2x/wk @ 60 min), walking 5 x week (1.5-2 miles daily)  Progress Towards Goal(s):  In progress.  Handouts given during visit  include:  Phase 3B: High Protein + Non-Starchy   Nutritional Diagnosis:  [place]-3.3 Overweight/obesity related to past poor dietary habits and physical inactivity as evidenced by patient w/ recent LAGB surgery following dietary guidelines for continued weight loss.    Intervention:  Nutrition education/diet advancement.  Monitoring/Evaluation:  Dietary intake, exercise, lap band fills, and body weight. Follow up in 3 months for 6 month post-op visit.

## 2013-01-13 NOTE — Patient Instructions (Addendum)
Goals:  Follow Phase 3B: High Protein + Non-Starchy Vegetables  Eat 3-6 small meals/snacks, every 3-5 hrs  Increase lean protein foods to meet 60-80g goal  Increase fluid intake to 64oz +  Avoid drinking 15 minutes before, during and 30 minutes after eating  Aim for >30 min of physical activity daily  Try kale chips or roasted chick peas for "crunch"   TANITA  BODY COMP RESULTS  10/07/12 11/02/12 12/06/12 01/13/13   BMI (kg/m^2) 41.5 39.9 38.5 37.2   Fat Mass (lbs) 103.5 100.0 89.5 83.0   Fat Free Mass (lbs) 109.0 104.5 107.5 107.5   Total Body Water (lbs) 80.0 76.5 78.5 78.5

## 2013-01-20 ENCOUNTER — Other Ambulatory Visit: Payer: Self-pay

## 2013-01-20 ENCOUNTER — Encounter (INDEPENDENT_AMBULATORY_CARE_PROVIDER_SITE_OTHER): Payer: Self-pay

## 2013-01-20 ENCOUNTER — Ambulatory Visit (INDEPENDENT_AMBULATORY_CARE_PROVIDER_SITE_OTHER): Payer: Self-pay | Admitting: Physician Assistant

## 2013-01-20 VITALS — BP 124/80 | HR 84 | Temp 98.0°F | Resp 15 | Ht 60.0 in | Wt 189.8 lb

## 2013-01-20 DIAGNOSIS — Z4651 Encounter for fitting and adjustment of gastric lap band: Secondary | ICD-10-CM

## 2013-01-20 NOTE — Progress Notes (Signed)
  HISTORY: Katherine Henry is a 52 y.o.female who received an AP-Standard lap-band in August 2014 by Dr. Andrey Campanile. She comes in with stable weight since seeing Dr. Andrey Campanile on 10/20 when 0.6 mL fluid was removed (his note on Epic reads a fluid addition). She has no further obstructive symptoms since then but she has noticed an increase in portions and hunger. She reports trying to avoid coming in for a fill but she thinks it's necessary.  VITAL SIGNS: Filed Vitals:   01/20/13 1538  BP: 124/80  Pulse: 84  Temp: 98 F (36.7 C)  Resp: 15    PHYSICAL EXAM: Physical exam reveals a very well-appearing 52 y.o.female in no apparent distress Neurologic: Awake, alert, oriented Psych: Bright affect, conversant Respiratory: Breathing even and unlabored. No stridor or wheezing Abdomen: Soft, nontender, nondistended to palpation. Incisions well-healed. No incisional hernias. Port easily palpated. Extremities: Atraumatic, good range of motion.  ASSESMENT: 52 y.o.  female  s/p AP-Standard lap-band.   PLAN: The patient's port was accessed with a 20G Huber needle without difficulty. Clear fluid was aspirated and 0.3 mL saline was added to the port to give a total predicted volume of 5 mL. The patient was able to swallow water without difficulty following the procedure and was instructed to take clear liquids for the next 24-48 hours and advance slowly as tolerated.

## 2013-01-20 NOTE — Patient Instructions (Signed)

## 2013-02-24 ENCOUNTER — Encounter (INDEPENDENT_AMBULATORY_CARE_PROVIDER_SITE_OTHER): Payer: Self-pay

## 2013-02-24 ENCOUNTER — Ambulatory Visit (INDEPENDENT_AMBULATORY_CARE_PROVIDER_SITE_OTHER): Payer: BC Managed Care – PPO | Admitting: Physician Assistant

## 2013-02-24 VITALS — BP 126/76 | HR 68 | Temp 98.9°F | Resp 14 | Ht 60.0 in | Wt 184.2 lb

## 2013-02-24 DIAGNOSIS — Z9884 Bariatric surgery status: Secondary | ICD-10-CM

## 2013-02-24 NOTE — Progress Notes (Signed)
  HISTORY: Katherine Henry is a 52 y.o.female who received an AP-Standard lap-band in August 2014 by Dr. Andrey Campanile. She comes in with close to 6 lbs weight loss since her last visit. Smaller portions are satisfying her for longer periods and she's having no persistent regurgitation or reflux. She has had some GERD symptoms for which she is taking OTC remedies which appear to help. She denies night cough. As she is traveling over the holiday and a fill would place her at the same volume she had difficulty with in October, she is reluctant to have a fill.  VITAL SIGNS: Filed Vitals:   02/24/13 1633  BP: 126/76  Pulse: 68  Temp: 98.9 F (37.2 C)  Resp: 14    PHYSICAL EXAM: Physical exam reveals a very well-appearing 52 y.o.female in no apparent distress Neurologic: Awake, alert, oriented Psych: Bright affect, conversant Respiratory: Breathing even and unlabored. No stridor or wheezing Extremities: Atraumatic, good range of motion. Skin: Warm, Dry, no rashes Musculoskeletal: Normal gait, Joints normal  ASSESMENT: 52 y.o.  female  s/p AP-Standard lap-band.   PLAN: As she appears to be in the green zone and she's traveling over the holiday, we deferred a fill today. I encouraged her to continue to watch her portion sizes and her overall food choices. She will see [place] in 5 weeks or sooner if needed.

## 2013-02-24 NOTE — Patient Instructions (Signed)
Return in one month. Focus on good food choices as well as physical activity. Return sooner if you have an increase in hunger, portion sizes or weight. Return also for difficulty swallowing, night cough, reflux.   

## 2013-03-31 ENCOUNTER — Ambulatory Visit (INDEPENDENT_AMBULATORY_CARE_PROVIDER_SITE_OTHER): Payer: BC Managed Care – PPO | Admitting: Physician Assistant

## 2013-03-31 ENCOUNTER — Encounter (INDEPENDENT_AMBULATORY_CARE_PROVIDER_SITE_OTHER): Payer: Self-pay

## 2013-03-31 VITALS — BP 132/84 | HR 72 | Temp 98.4°F | Resp 14 | Ht 60.0 in | Wt 182.8 lb

## 2013-03-31 DIAGNOSIS — Z4651 Encounter for fitting and adjustment of gastric lap band: Secondary | ICD-10-CM

## 2013-03-31 NOTE — Progress Notes (Signed)
  HISTORY: Katherine Henry is a 53 y.o.female who received an AP-Standard lap-band in August 2014 by Dr. Redmond Pulling. She comes in with steady weight since her last visit. She's had some family issues, namely the passing of her brother-in-law in the past month, which has taken her off her normal exercise schedule as well as creating understandable stressors. She denies regurgitation or reflux. She reports some hunger but not extreme. Her portions are relatively small compared to pre-op.  VITAL SIGNS: Filed Vitals:   03/31/13 1655  BP: 132/84  Pulse: 72  Temp: 98.4 F (36.9 C)  Resp: 14    PHYSICAL EXAM: Physical exam reveals a very well-appearing 53 y.o.female in no apparent distress Neurologic: Awake, alert, oriented Psych: Bright affect, conversant Respiratory: Breathing even and unlabored. No stridor or wheezing Abdomen: Soft, nontender, nondistended to palpation. Incisions well-healed. No incisional hernias. Port easily palpated. Extremities: Atraumatic, good range of motion.  ASSESMENT: 53 y.o.  female  s/p AP-Standard lap-band.   PLAN: The patient's port was accessed with a 20G Huber needle without difficulty. Clear fluid was aspirated and 0.25 mL saline was added to the port to give a total predicted volume of 5.25 mL. The patient was able to swallow water without difficulty following the procedure and was instructed to take clear liquids for the next 24-48 hours and advance slowly as tolerated.

## 2013-03-31 NOTE — Patient Instructions (Signed)

## 2013-04-01 ENCOUNTER — Encounter (INDEPENDENT_AMBULATORY_CARE_PROVIDER_SITE_OTHER): Payer: Self-pay | Admitting: General Surgery

## 2013-04-11 ENCOUNTER — Encounter: Payer: BC Managed Care – PPO | Attending: General Surgery | Admitting: Dietician

## 2013-04-11 VITALS — Ht 60.0 in | Wt 186.0 lb

## 2013-04-11 DIAGNOSIS — Z6836 Body mass index (BMI) 36.0-36.9, adult: Secondary | ICD-10-CM | POA: Insufficient documentation

## 2013-04-11 DIAGNOSIS — Z713 Dietary counseling and surveillance: Secondary | ICD-10-CM | POA: Insufficient documentation

## 2013-04-11 DIAGNOSIS — E669 Obesity, unspecified: Secondary | ICD-10-CM | POA: Insufficient documentation

## 2013-04-11 DIAGNOSIS — Z9884 Bariatric surgery status: Secondary | ICD-10-CM | POA: Insufficient documentation

## 2013-04-11 NOTE — Progress Notes (Signed)
Follow-up visit:  12 Weeks Post-Operative LAGB Surgery  Medical Nutrition Therapy:  Appt start time: 4801   End time: 6553.  Primary concerns today: Post-operative Bariatric Surgery Nutrition Management. Alese returns today with a 4.5 lbs weight loss. Tolerating everything after her last fill on 03/31/2013. Had a lot of stress d/t a death in the family in March 01, 2023. Was not able to get in water and was taking "bites" of all different foods after that. Also stopped exercising at that time.   Feels like she is in the "yellow zone" though she is satisfied with the most recent fill.   Surgery date: 10/18/12 Surgery type: LAGB Start weight at [facility]: 207.9 lbs (08/24/12) Pre-Op class weight: 212.5 lbs (10/07/12) Goal weight: 160 lbs % of Goal Weight: 50%  Weight today: 186.0 lbs Weight change: 4.5 lbs LOSS Total weight lost: 26.5 lbs   TANITA  BODY COMP RESULTS  10/07/12 11/02/12 12/06/12 01/13/13 04/11/13   BMI (kg/m^2) 41.5 39.9 38.5 37.2 36.3   Fat Mass (lbs) 103.5 100.0 89.5 83.0 78.0   Fat Free Mass (lbs) 109.0 104.5 107.5 107.5 108.0   Total Body Water (lbs) 80.0 76.5 78.5 78.5 79.0   24-hr recall: B (AM): cottage cheese 1/4c or yogurt or egg with cheese and Canadian Bacon (7g) Snk (AM): almonds L (PM): 2-3 oz tuna, green beans or salad (20-25g) Snk (PM): [place] yogurt Dannon L&F (12g)  D (PM): protein with zucchini/squash or other vegetable (20g) Snk (PM): yogurt or almonds  Fluid intake:  Water 64 oz; 2 cups black coffee (w/ caffeine) Estimated total protein intake: 65-70g  Medications: See med list Supplementation:  Taking MVI & Calcium (1000 mg over 2 doses)  Using straws: No Drinking while eating: No Hair loss: No Carbonated beverages: No, a couple of sips N/V/D/C: Chronic constipation and gas (from vegetables). Takes miralax.  Last Lap-Band fill:  03/31/13 - 0.25 cc added   Recent physical activity:  Yoga (2x/wk @ 60 min), walking 2 x week (30 minutes)  Progress Towards  Goal(s):  In progress.   Nutritional Diagnosis:  [place]-3.3 Overweight/obesity related to past poor dietary habits and physical inactivity as evidenced by patient w/ recent LAGB surgery following dietary guidelines for continued weight loss.    Intervention:  Nutrition education/diet advancement.  Monitoring/Evaluation:  Dietary intake, exercise, lap band fills, and body weight. Follow up in 3 months for 9 month post-op visit.

## 2013-04-11 NOTE — Patient Instructions (Addendum)
Goals:  Follow Phase 3B: High Protein + Non-Starchy Vegetables  Eat 3-6 small meals/snacks, every 3-5 hrs  Increase lean protein foods to meet 60-80g goal  Increase fluid intake to 64oz +  Avoid drinking 15 minutes before, during and 30 minutes after eating  Aim for >30 min of physical activity daily (Consider more exercise to help manage stress).   Consider more exercise or schedule a massage to help manage stress   Try the Quest Protein chips from [facility] or the [employer].   TANITA  BODY COMP RESULTS  10/07/12 11/02/12 12/06/12 01/13/13 04/11/13   BMI (kg/m^2) 41.5 39.9 38.5 37.2 36.3   Fat Mass (lbs) 103.5 100.0 89.5 83.0 78.0   Fat Free Mass (lbs) 109.0 104.5 107.5 107.5 108.0   Total Body Water (lbs) 80.0 76.5 78.5 78.5 79.0

## 2013-05-05 ENCOUNTER — Ambulatory Visit (INDEPENDENT_AMBULATORY_CARE_PROVIDER_SITE_OTHER): Payer: BC Managed Care – PPO | Admitting: Physician Assistant

## 2013-05-05 ENCOUNTER — Encounter (INDEPENDENT_AMBULATORY_CARE_PROVIDER_SITE_OTHER): Payer: Self-pay

## 2013-05-05 VITALS — BP 128/72 | HR 64 | Temp 98.0°F | Resp 14 | Ht 60.0 in | Wt 178.6 lb

## 2013-05-05 DIAGNOSIS — Z9884 Bariatric surgery status: Secondary | ICD-10-CM

## 2013-05-05 NOTE — Patient Instructions (Signed)
Return in one month. Focus on good food choices as well as physical activity. Return sooner if you have an increase in hunger, portion sizes or weight. Return also for difficulty swallowing, night cough, reflux.

## 2013-05-05 NOTE — Progress Notes (Signed)
  HISTORY: Katherine Henry is a 53 y.o.female who received an AP-Standard lap-band in August 2014 by Dr. Redmond Pulling. She comes in today with 4 lbs weight loss since her last visit a month ago. She has restarted her exercise regimen that was slowed due to a death in the family in 2023-03-14. She realized how much she missed physical activity so she was eager to resume. She feels good restriction and is pleased with her portion sizes and hunger control.  VITAL SIGNS: Filed Vitals:   05/05/13 1605  BP: 128/72  Pulse: 64  Temp: 98 F (36.7 C)  Resp: 14    PHYSICAL EXAM: Physical exam reveals a very well-appearing 53 y.o.female in no apparent distress Neurologic: Awake, alert, oriented Psych: Bright affect, conversant Respiratory: Breathing even and unlabored. No stridor or wheezing Extremities: Atraumatic, good range of motion. Skin: Warm, Dry, no rashes Musculoskeletal: Normal gait, Joints normal  ASSESMENT: 53 y.o.  female  s/p AP-Standard lap-band.   PLAN: We deferred a fill today as she's in the green zone. We talked about continued good eating habits and physical activity. We'll have her back in one month or sooner if needed.

## 2013-06-02 ENCOUNTER — Encounter (INDEPENDENT_AMBULATORY_CARE_PROVIDER_SITE_OTHER): Payer: BC Managed Care – PPO

## 2013-06-09 ENCOUNTER — Encounter (INDEPENDENT_AMBULATORY_CARE_PROVIDER_SITE_OTHER): Payer: BC Managed Care – PPO

## 2013-06-30 ENCOUNTER — Encounter (INDEPENDENT_AMBULATORY_CARE_PROVIDER_SITE_OTHER): Payer: Self-pay | Admitting: Physician Assistant

## 2013-06-30 ENCOUNTER — Ambulatory Visit (INDEPENDENT_AMBULATORY_CARE_PROVIDER_SITE_OTHER): Payer: BC Managed Care – PPO | Admitting: Physician Assistant

## 2013-06-30 VITALS — HR 77 | Temp 98.1°F | Resp 14

## 2013-06-30 DIAGNOSIS — Z4651 Encounter for fitting and adjustment of gastric lap band: Secondary | ICD-10-CM

## 2013-06-30 NOTE — Patient Instructions (Signed)
Return in two weeks. Focus on good food choices as well as physical activity. Return sooner if you have an increase in hunger, portion sizes or weight. Return also for difficulty swallowing, night cough, reflux.   

## 2013-06-30 NOTE — Progress Notes (Signed)
  HISTORY: Katherine Henry is a 53 y.o.female who received an AP-Standard lap-band in August 2014 by Dr. Redmond Pulling. She comes in today with a two week history of increasing dysphagia and persistent GERD symptoms. She is taking Prevacid daily with little relief. She had been suffering a URI for which she had been taking antibitotics and other respiratory preparations. Once the URI resolved, she began having difficulty tolerating solids and this just progressed.  VITAL SIGNS: Filed Vitals:   06/30/13 1625  Pulse: 77  Temp: 98.1 F (36.7 C)  Resp: 14    PHYSICAL EXAM: Physical exam reveals a very well-appearing 53 y.o.female in no apparent distress Neurologic: Awake, alert, oriented Psych: Bright affect, conversant Respiratory: Breathing even and unlabored. No stridor or wheezing Abdomen: Soft, nontender, nondistended to palpation. Incisions well-healed. No incisional hernias. Port easily palpated. Extremities: Atraumatic, good range of motion.  ASSESMENT: 53 y.o.  female  s/p AP-Standard lap-band.   PLAN: The patient's port was accessed with a 20G Huber needle without difficulty. Clear fluid was aspirated and 1.5 mL saline was removed from the port to give a total predicted volume of 3.75 mL. The patient was advised to concentrate on healthy food choices and to avoid slider foods high in fats and carbohydrates. She was able to drink water with much greater ease after fluid removal. She describes feeling much better. I asked her to continue Prevacid daily until she sees me in a couple of weeks. I also suggested taking liquids for the next couple of days then advancing as tolerated. She voiced understanding and agreement.

## 2013-07-11 ENCOUNTER — Ambulatory Visit: Payer: BC Managed Care – PPO | Admitting: Dietician

## 2013-07-21 ENCOUNTER — Encounter (INDEPENDENT_AMBULATORY_CARE_PROVIDER_SITE_OTHER): Payer: Self-pay

## 2013-07-21 ENCOUNTER — Ambulatory Visit (INDEPENDENT_AMBULATORY_CARE_PROVIDER_SITE_OTHER): Payer: BC Managed Care – PPO | Admitting: Physician Assistant

## 2013-07-21 VITALS — BP 126/74 | HR 75 | Temp 98.2°F | Ht 60.0 in | Wt 177.4 lb

## 2013-07-21 DIAGNOSIS — Z4651 Encounter for fitting and adjustment of gastric lap band: Secondary | ICD-10-CM

## 2013-07-21 NOTE — Progress Notes (Signed)
  HISTORY: Katherine Henry is a 53 y.o.female who received an AP-Standard lap-band in August 2014 by Dr. Redmond Pulling. She comes in with 1 lb weight loss since her last visit when 1.5 mL was removed for reflux. She reports feeling much better now. The patient has no new complaints. There is no new history of persistent regurgitation or reflux, no night cough. There is increased hunger and larger than desired portion sizes since the last visit. She would like a fill today.  VITAL SIGNS: Filed Vitals:   07/21/13 1624  BP: 126/74  Pulse: 75  Temp: 98.2 F (36.8 C)    PHYSICAL EXAM: Physical exam reveals a very well-appearing 53 y.o.female in no apparent distress Neurologic: Awake, alert, oriented Psych: Bright affect, conversant Respiratory: Breathing even and unlabored. No stridor or wheezing Abdomen: Soft, nontender, nondistended to palpation. Incisions well-healed. No incisional hernias. Port easily palpated. Extremities: Atraumatic, good range of motion.  ASSESMENT: 53 y.o.  female  s/p AP-Standard lap-band.   PLAN: The patient's port was accessed with a 20G Huber needle without difficulty. Clear fluid was aspirated and 1 mL saline was added to the port to give a total predicted volume of 4.75 mL. The patient was able to swallow water without difficulty following the procedure and was instructed to take clear liquids for the next 24-48 hours and advance slowly as tolerated.

## 2013-07-21 NOTE — Patient Instructions (Signed)

## 2013-09-15 ENCOUNTER — Encounter (INDEPENDENT_AMBULATORY_CARE_PROVIDER_SITE_OTHER): Payer: BC Managed Care – PPO

## 2013-09-29 ENCOUNTER — Encounter (INDEPENDENT_AMBULATORY_CARE_PROVIDER_SITE_OTHER): Payer: BC Managed Care – PPO

## 2013-10-13 ENCOUNTER — Encounter (INDEPENDENT_AMBULATORY_CARE_PROVIDER_SITE_OTHER): Payer: BC Managed Care – PPO

## 2013-10-20 ENCOUNTER — Encounter (INDEPENDENT_AMBULATORY_CARE_PROVIDER_SITE_OTHER): Payer: Self-pay

## 2013-10-20 ENCOUNTER — Ambulatory Visit (INDEPENDENT_AMBULATORY_CARE_PROVIDER_SITE_OTHER): Payer: BC Managed Care – PPO | Admitting: Physician Assistant

## 2013-10-20 VITALS — BP 120/74 | HR 64 | Temp 98.6°F | Resp 14 | Ht 60.0 in | Wt 178.4 lb

## 2013-10-20 DIAGNOSIS — Z4651 Encounter for fitting and adjustment of gastric lap band: Secondary | ICD-10-CM

## 2013-10-20 NOTE — Progress Notes (Signed)
  HISTORY: Katherine Henry is a 53 y.o.female who received an AP-Standard lap-band in August 2014 by Dr. Redmond Pulling. The patient has had steady weight since their last visit in May, and has lost 38 lbs since surgery.  She had fluid removed in April prior to travel. 1 mL was replaced in May but she had begun prednisone since then. She is now off the prednisone but is still having hunger and larger portion sizes, as expected. She has no complaint of regurgitation or reflux. She would like a fill today.  VITAL SIGNS: Filed Vitals:   10/20/13 1000  BP: 120/74  Pulse: 64  Temp: 98.6 F (37 C)  Resp: 14    PHYSICAL EXAM: Physical exam reveals a very well-appearing 53 y.o.female in no apparent distress Neurologic: Awake, alert, oriented Psych: Bright affect, conversant Respiratory: Breathing even and unlabored. No stridor or wheezing Abdomen: Soft, nontender, nondistended to palpation. Incisions well-healed. No incisional hernias. Port easily palpated. Extremities: Atraumatic, good range of motion.  ASSESMENT: 53 y.o.  female  s/p AP-Standard lap-band.   PLAN: The patient's port was accessed with a 20G Huber needle without difficulty. Clear fluid was aspirated and 0.5 mL saline was added to the port to give a total predicted volume of 5.25 mL. The patient was able to swallow water without difficulty following the procedure and was instructed to take clear liquids for the next 24-48 hours and advance slowly as tolerated. She is having her one year anniversary photo today. We'll have her back in three months or sooner if needed.

## 2013-10-20 NOTE — Patient Instructions (Signed)

## 2014-01-19 ENCOUNTER — Encounter (INDEPENDENT_AMBULATORY_CARE_PROVIDER_SITE_OTHER): Payer: BC Managed Care – PPO

## 2015-04-13 IMAGING — CR DG CHEST 2V
2 series · 2 of 2 positions shown · non-contrast
Comparison: None
Correlation:  CT chest 02/13/2011

CLINICAL DATA: Former smoker, asthma, morbid obesity, pre bariatric
surgery screening

CHEST - 2 VIEW

[view not recorded (1 of 2)]
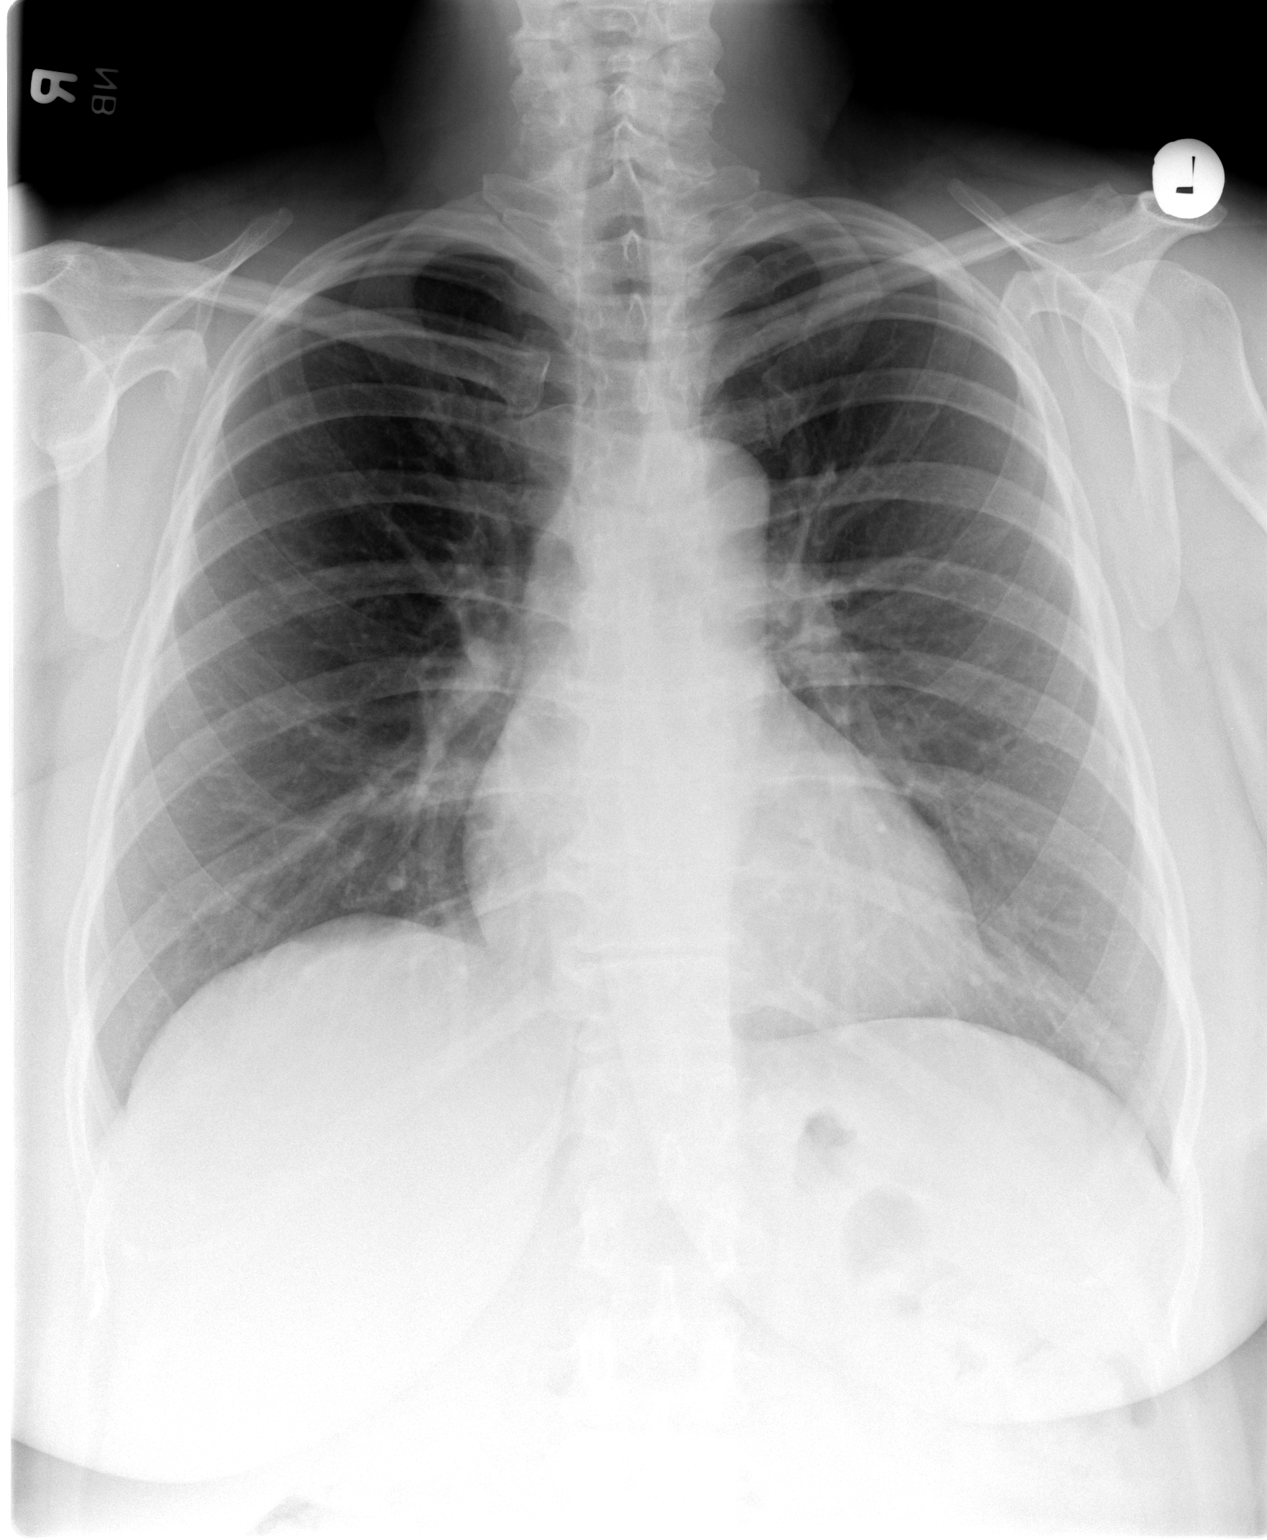

[view not recorded (2 of 2)]
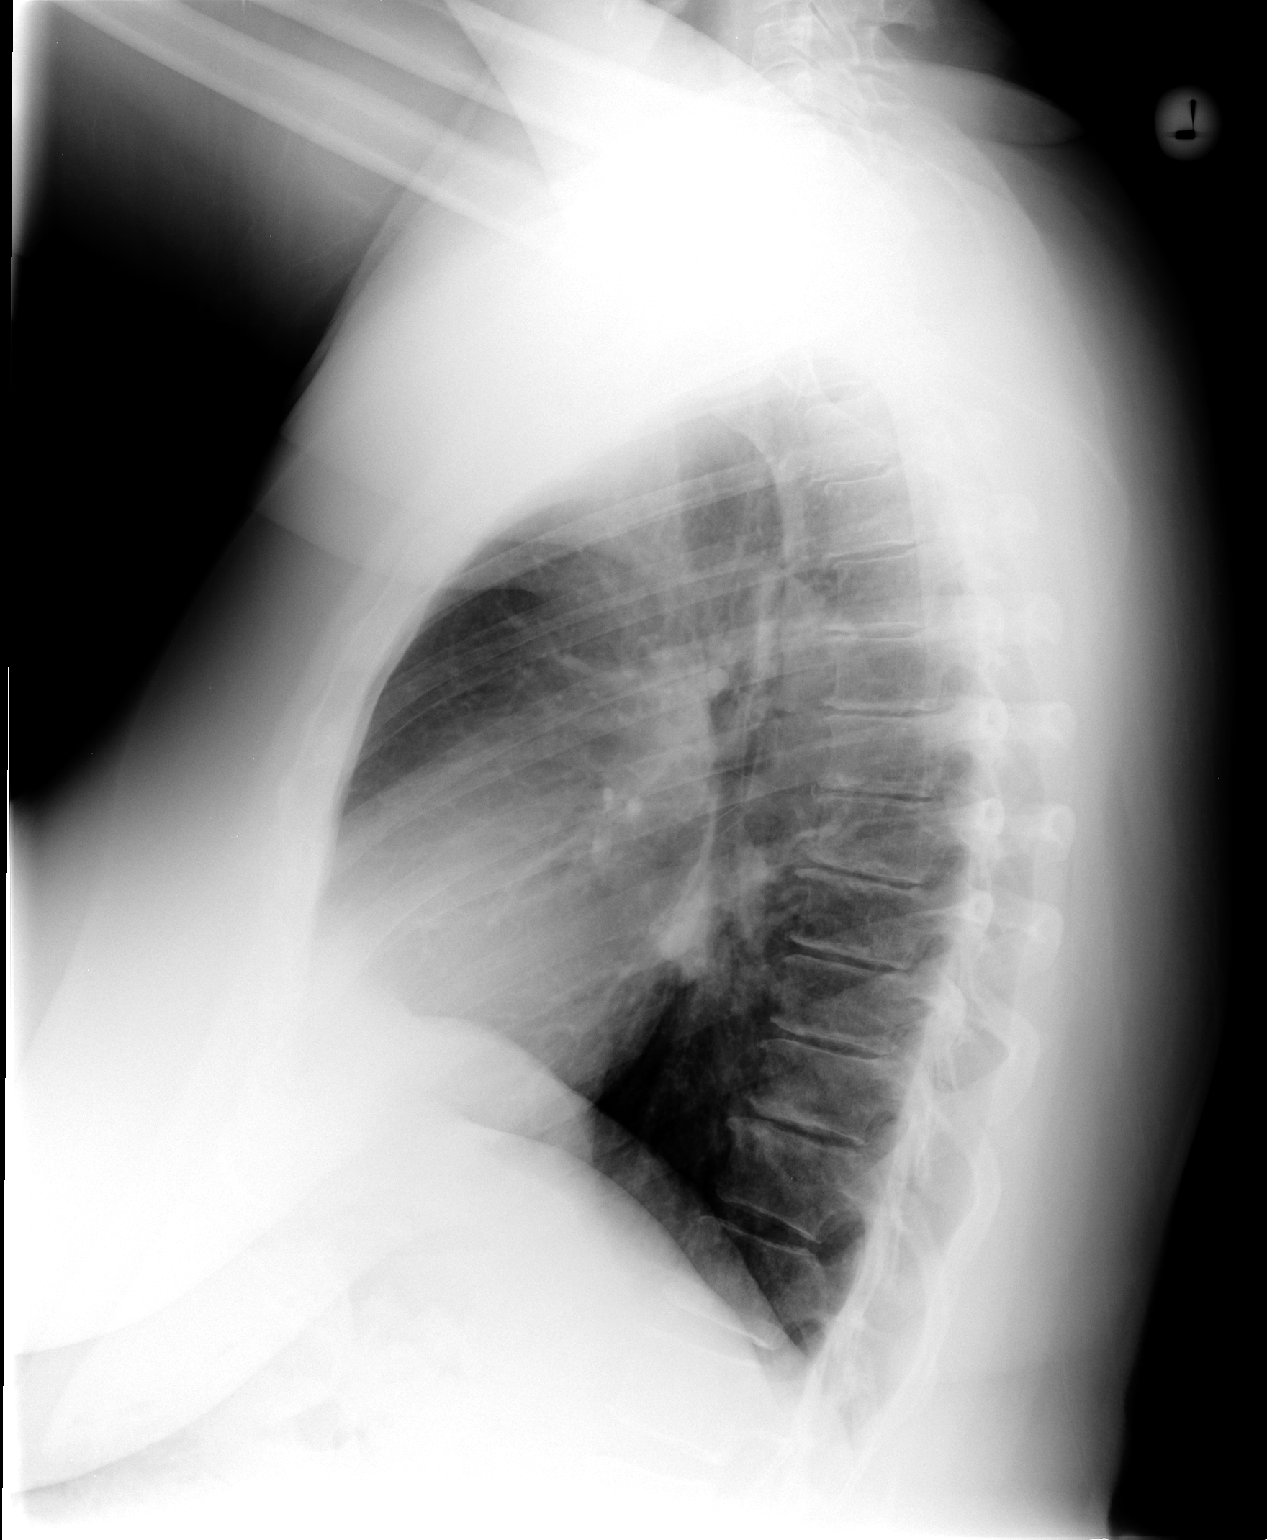

[2 of 2 positions shown; findings below may reference images not displayed]

FINDINGS: Normal heart size, mediastinal contours, and pulmonary vascularity.
Lungs clear.
No pleural effusion or pneumothorax.
No acute osseous findings.
IMPRESSION: No acute abnormalities.

## 2015-04-13 IMAGING — RF DG UGI W/ KUB
9 series · 9 of 9 positions shown · non-contrast
Comparison: No similar prior study is available for comparison.

CLINICAL DATA: Morbid obesity, pre - bariatric surgery screening
exam.

UPPER GI SERIES WITH KUB
TECHNIQUE: Routine upper GI series was performed with thin barium
single contrast technique.
Fluoroscopy Time:  6 seconds

[Series 1: run · 1 of 1 slices shown (1 of 8)]
[im 1/1]
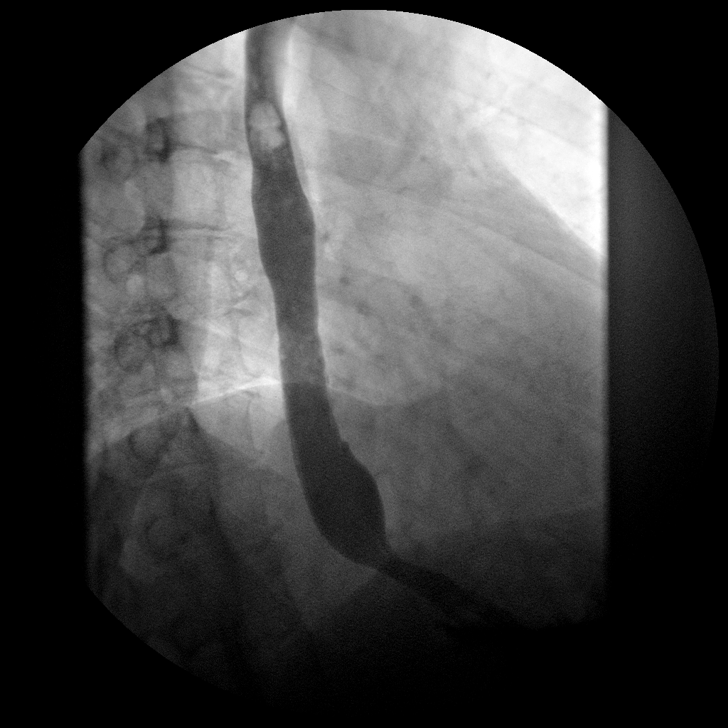

[Series 2: run · 1 of 1 slices shown (2 of 8)]
[im 1/1]
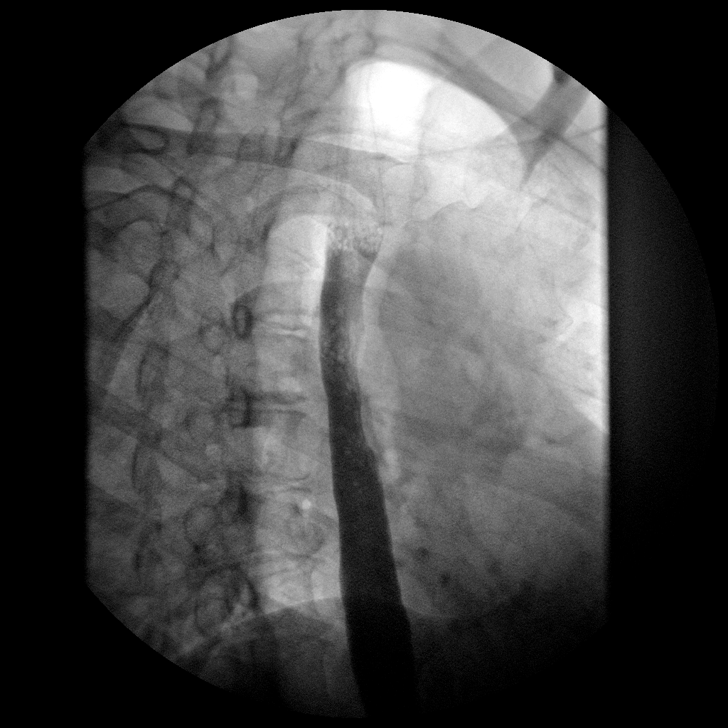

[Series 3: run · 1 of 1 slices shown (3 of 8)]
[im 1/1]
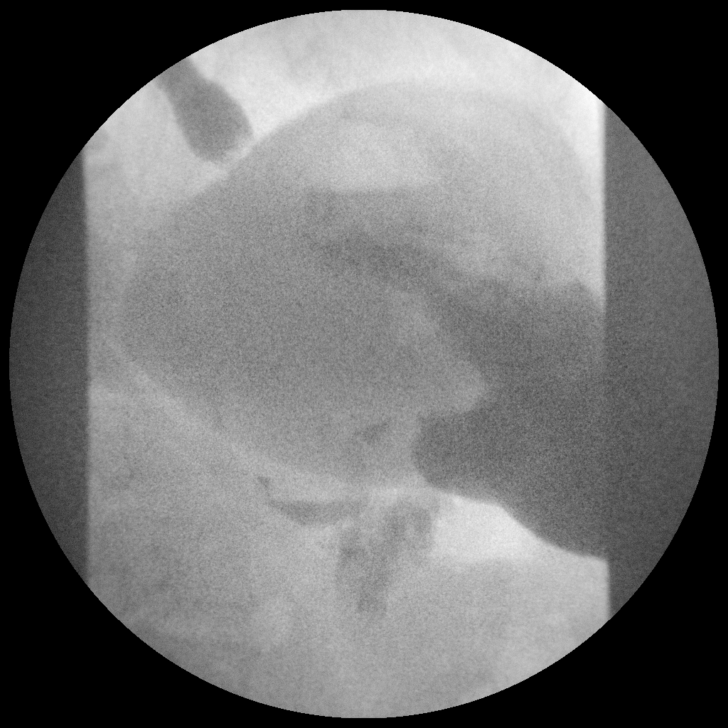

[Series 4: run · 1 of 1 slices shown (4 of 8)]
[im 1/1]
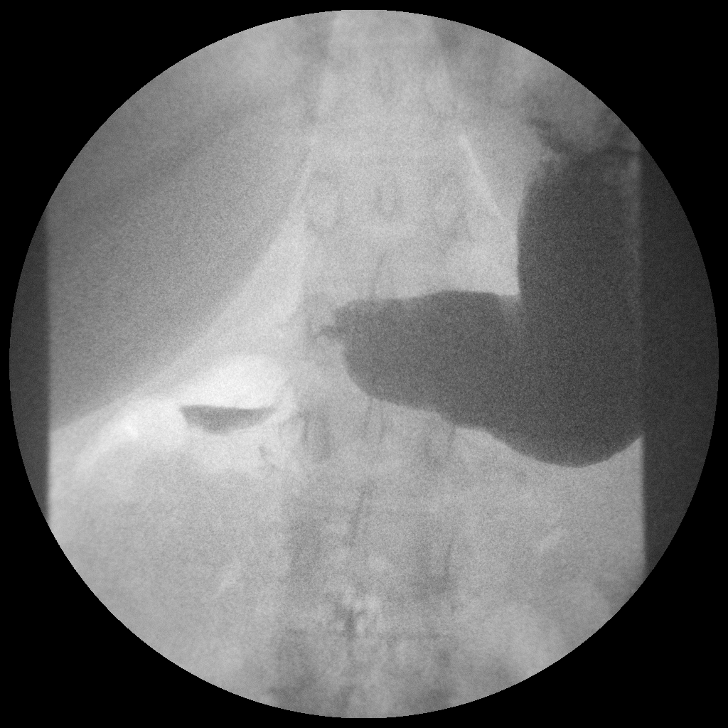

[Series 5: run · 1 of 1 slices shown (5 of 8)]
[im 1/1]
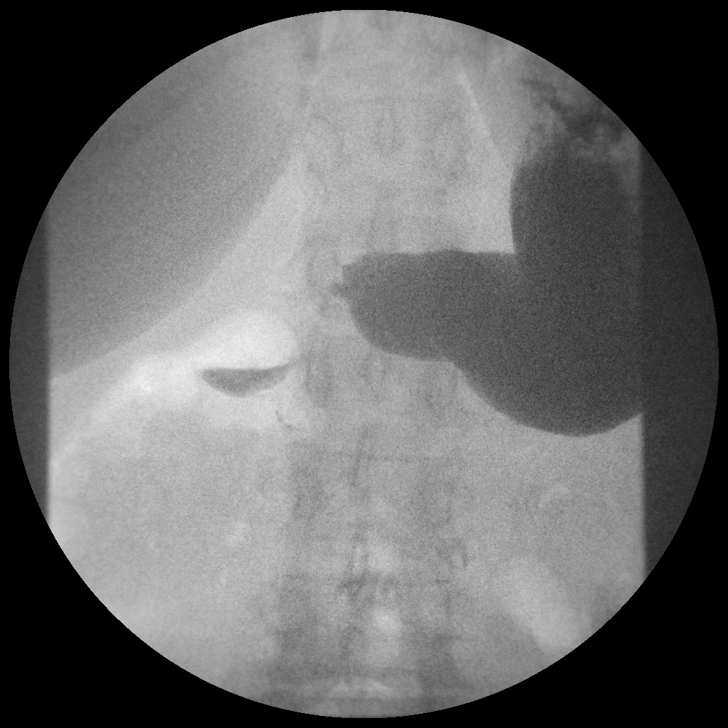

[Series 6: run · 1 of 1 slices shown (6 of 8)]
[im 1/1]
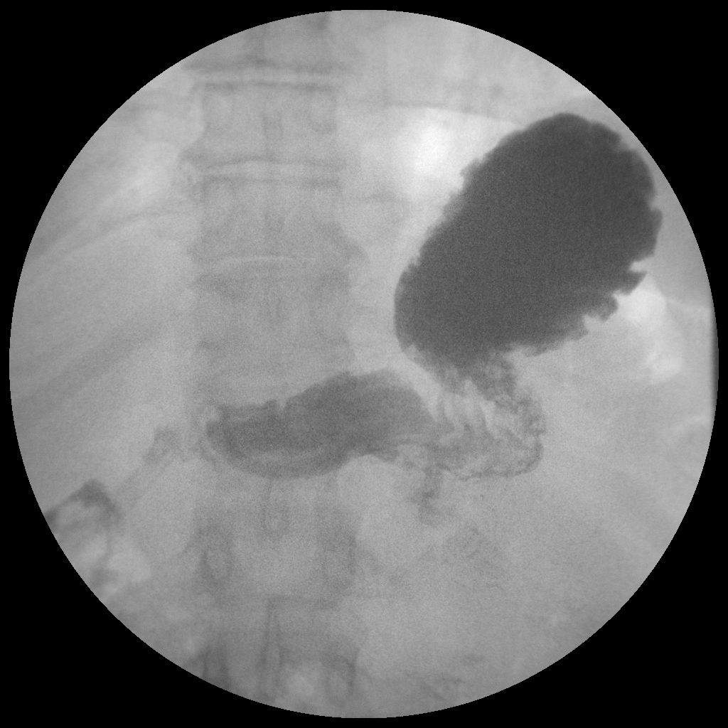

[Series 7: run · 1 of 1 slices shown (7 of 8)]
[im 1/1]
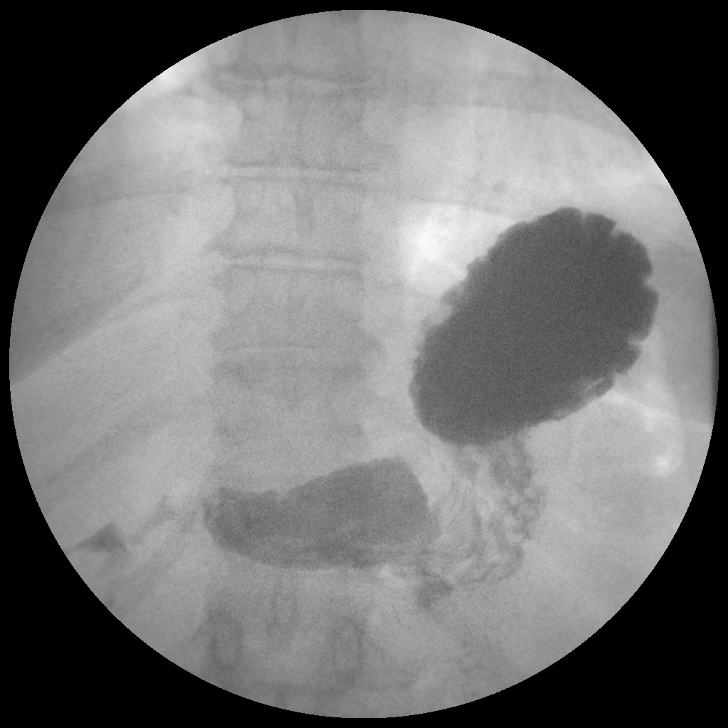

[Series 8: run · 1 of 1 slices shown (8 of 8)]
[im 1/1]
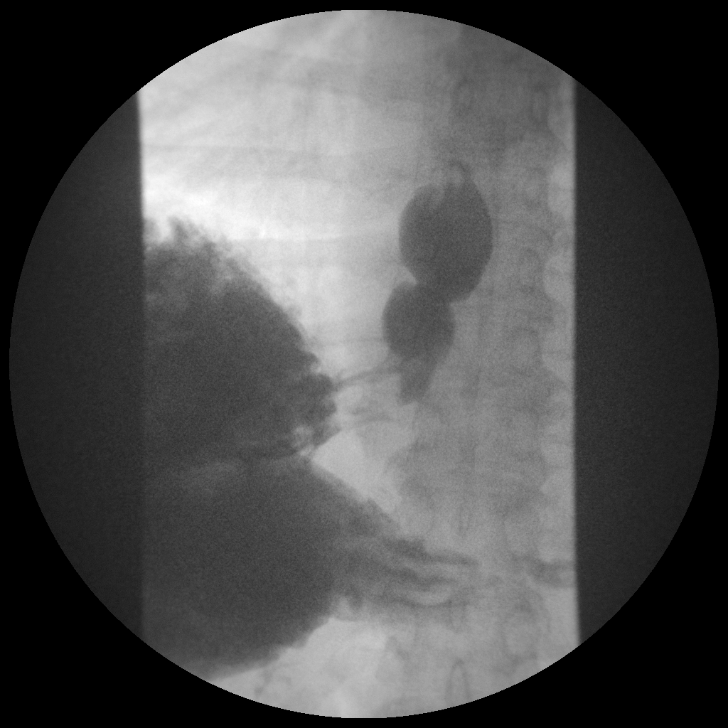

[Series 1001: view not recorded · 0.20mm/px · 1 of 1 slices shown]
[im 1/1]
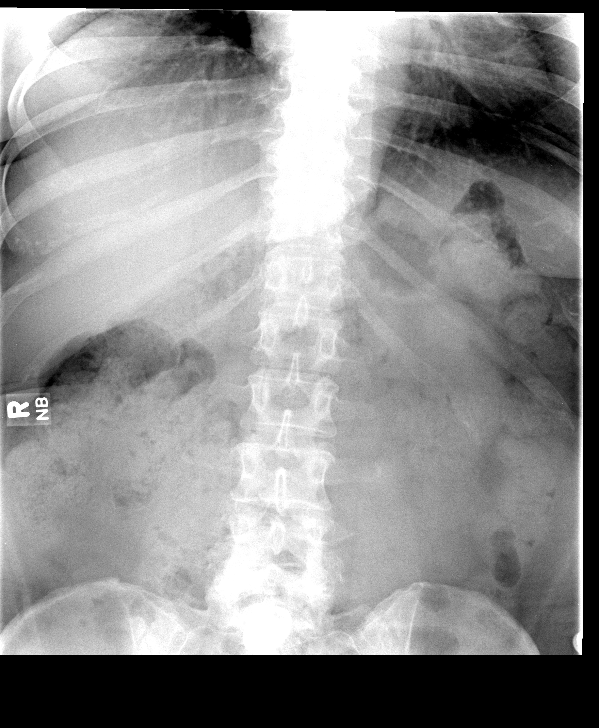

[9 of 9 positions shown; findings below may reference images not displayed]

FINDINGS: The esophagus is normal in course and caliber.  No
extrinsic compression or intrinsic filling defect is seen.  The
stomach is normal in contour given single contrast technique.
Ligament of Treitz is properly located.  No reflux was elicited. []
On prone imaging only, a small sliding type hiatal hernia is
incidentally noted.
IMPRESSION: [Small sliding type hiatal hernia noted on prone imaging only.
Otherwise normal exam.]

## 2016-01-28 ENCOUNTER — Other Ambulatory Visit (HOSPITAL_COMMUNITY): Payer: Self-pay | Admitting: General Surgery

## 2016-01-28 DIAGNOSIS — K219 Gastro-esophageal reflux disease without esophagitis: Secondary | ICD-10-CM

## 2016-02-14 ENCOUNTER — Ambulatory Visit (HOSPITAL_COMMUNITY): Payer: BC Managed Care – PPO

## 2016-02-15 ENCOUNTER — Ambulatory Visit (HOSPITAL_COMMUNITY)
Admission: RE | Admit: 2016-02-15 | Discharge: 2016-02-15 | Disposition: A | Discharge status: Home / Self Care | Payer: BC Managed Care – PPO | Source: Ambulatory Visit | Attending: General Surgery | Admitting: General Surgery

## 2016-02-15 ENCOUNTER — Encounter (HOSPITAL_COMMUNITY): Payer: Self-pay | Admitting: Radiology

## 2016-02-15 ENCOUNTER — Other Ambulatory Visit (HOSPITAL_COMMUNITY): Payer: Self-pay | Admitting: Diagnostic Radiology

## 2016-02-15 ENCOUNTER — Ambulatory Visit (HOSPITAL_COMMUNITY)
Admission: RE | Admit: 2016-02-15 | Discharge: 2016-02-15 | Disposition: A | Discharge status: Home / Self Care | Payer: BC Managed Care – PPO | Source: Ambulatory Visit | Attending: Diagnostic Radiology | Admitting: Diagnostic Radiology

## 2016-02-15 DIAGNOSIS — R52 Pain, unspecified: Secondary | ICD-10-CM

## 2016-02-15 DIAGNOSIS — Z9884 Bariatric surgery status: Secondary | ICD-10-CM | POA: Diagnosis not present

## 2016-02-15 DIAGNOSIS — K228 Other specified diseases of esophagus: Secondary | ICD-10-CM | POA: Diagnosis not present

## 2016-02-15 DIAGNOSIS — K219 Gastro-esophageal reflux disease without esophagitis: Secondary | ICD-10-CM | POA: Diagnosis present

## 2016-10-09 ENCOUNTER — Other Ambulatory Visit: Payer: Self-pay | Admitting: General Surgery

## 2016-10-09 DIAGNOSIS — K219 Gastro-esophageal reflux disease without esophagitis: Secondary | ICD-10-CM

## 2016-11-04 ENCOUNTER — Other Ambulatory Visit (HOSPITAL_COMMUNITY): Payer: Self-pay | Admitting: General Surgery

## 2016-11-04 DIAGNOSIS — K219 Gastro-esophageal reflux disease without esophagitis: Secondary | ICD-10-CM

## 2016-11-28 ENCOUNTER — Ambulatory Visit (HOSPITAL_COMMUNITY)
Admission: RE | Admit: 2016-11-28 | Discharge: 2016-11-28 | Disposition: A | Discharge status: Home / Self Care | Payer: BC Managed Care – PPO | Source: Ambulatory Visit | Attending: General Surgery | Admitting: General Surgery

## 2016-11-28 DIAGNOSIS — K219 Gastro-esophageal reflux disease without esophagitis: Secondary | ICD-10-CM | POA: Diagnosis present

## 2016-11-28 DIAGNOSIS — Z9884 Bariatric surgery status: Secondary | ICD-10-CM | POA: Diagnosis not present

## 2016-11-28 DIAGNOSIS — K224 Dyskinesia of esophagus: Secondary | ICD-10-CM | POA: Diagnosis not present

## 2017-06-02 ENCOUNTER — Other Ambulatory Visit: Payer: Self-pay | Admitting: General Surgery

## 2017-06-02 DIAGNOSIS — K219 Gastro-esophageal reflux disease without esophagitis: Secondary | ICD-10-CM

## 2017-07-06 ENCOUNTER — Other Ambulatory Visit: Payer: Self-pay | Admitting: General Surgery

## 2017-07-06 ENCOUNTER — Ambulatory Visit
Admission: RE | Admit: 2017-07-06 | Discharge: 2017-07-06 | Disposition: A | Discharge status: Home / Self Care | Payer: Commercial Managed Care - PPO | Source: Ambulatory Visit | Attending: General Surgery | Admitting: General Surgery

## 2017-07-06 DIAGNOSIS — K219 Gastro-esophageal reflux disease without esophagitis: Secondary | ICD-10-CM

## 2017-12-28 ENCOUNTER — Encounter: Payer: Self-pay | Admitting: Gastroenterology

## 2018-01-04 ENCOUNTER — Encounter: Payer: Self-pay | Admitting: Gastroenterology

## 2018-01-18 ENCOUNTER — Ambulatory Visit: Payer: Commercial Managed Care - PPO | Admitting: Gastroenterology

## 2018-01-22 ENCOUNTER — Ambulatory Visit: Payer: Commercial Managed Care - PPO | Admitting: Gastroenterology

## 2018-01-22 ENCOUNTER — Ambulatory Visit (AMBULATORY_SURGERY_CENTER): Payer: Self-pay

## 2018-01-22 VITALS — Ht 61.0 in | Wt 221.4 lb

## 2018-01-22 DIAGNOSIS — Z1211 Encounter for screening for malignant neoplasm of colon: Secondary | ICD-10-CM

## 2018-01-22 MED ORDER — NA SULFATE-K SULFATE-MG SULF 17.5-3.13-1.6 GM/177ML PO SOLN: 1.0000 | Freq: Once | ORAL | 0 refills | Status: AC

## 2018-01-22 NOTE — Progress Notes (Signed)
Per pt, no allergies to soy or egg products.Pt not taking any weight loss meds or using  O2 at home.  Pt refused emmi video. 

## 2018-02-08 ENCOUNTER — Encounter: Payer: Self-pay | Admitting: Gastroenterology

## 2018-02-16 ENCOUNTER — Ambulatory Visit (INDEPENDENT_AMBULATORY_CARE_PROVIDER_SITE_OTHER)
Admission: RE | Admit: 2018-02-16 | Discharge: 2018-02-16 | Disposition: A | Discharge status: Home / Self Care | Payer: Commercial Managed Care - PPO | Source: Ambulatory Visit | Attending: Pulmonary Disease | Admitting: Pulmonary Disease

## 2018-02-16 ENCOUNTER — Other Ambulatory Visit (INDEPENDENT_AMBULATORY_CARE_PROVIDER_SITE_OTHER): Payer: Commercial Managed Care - PPO

## 2018-02-16 ENCOUNTER — Ambulatory Visit: Payer: Commercial Managed Care - PPO | Admitting: Pulmonary Disease

## 2018-02-16 ENCOUNTER — Encounter: Payer: Self-pay | Admitting: Pulmonary Disease

## 2018-02-16 VITALS — BP 118/80 | HR 94 | Ht 59.69 in | Wt 222.0 lb

## 2018-02-16 DIAGNOSIS — Z9989 Dependence on other enabling machines and devices: Secondary | ICD-10-CM

## 2018-02-16 DIAGNOSIS — J45909 Unspecified asthma, uncomplicated: Secondary | ICD-10-CM

## 2018-02-16 DIAGNOSIS — G4733 Obstructive sleep apnea (adult) (pediatric): Secondary | ICD-10-CM

## 2018-02-16 DIAGNOSIS — Z9884 Bariatric surgery status: Secondary | ICD-10-CM | POA: Diagnosis not present

## 2018-02-16 LAB — CBC WITH DIFFERENTIAL/PLATELET
BASOS ABS: 0.1 10*3/uL (ref 0.0–0.1)
Basophils Relative: 0.8 % (ref 0.0–3.0)
Eosinophils Absolute: 0.3 10*3/uL (ref 0.0–0.7)
Eosinophils Relative: 4.7 % (ref 0.0–5.0)
HCT: 42 % (ref 36.0–46.0)
Hemoglobin: 14.1 g/dL (ref 12.0–15.0)
Lymphocytes Relative: 18.3 % (ref 12.0–46.0)
Lymphs Abs: 1.2 10*3/uL (ref 0.7–4.0)
MCHC: 33.6 g/dL (ref 30.0–36.0)
MCV: 89.9 fl (ref 78.0–100.0)
Monocytes Absolute: 0.7 10*3/uL (ref 0.1–1.0)
Monocytes Relative: 10.2 % (ref 3.0–12.0)
Neutro Abs: 4.4 10*3/uL (ref 1.4–7.7)
Neutrophils Relative %: 66 % (ref 43.0–77.0)
Platelets: 292 10*3/uL (ref 150.0–400.0)
RBC: 4.67 Mil/uL (ref 3.87–5.11)
RDW: 13.2 % (ref 11.5–15.5)
WBC: 6.7 10*3/uL (ref 4.0–10.5)

## 2018-02-16 LAB — NITRIC OXIDE: Nitric Oxide: 105

## 2018-02-16 MED ORDER — TIOTROPIUM BROMIDE MONOHYDRATE 1.25 MCG/ACT IN AERS: 2.0000 | INHALATION_SPRAY | Freq: Every day | RESPIRATORY_TRACT | 0 refills | Status: DC

## 2018-02-16 NOTE — Progress Notes (Signed)
Synopsis: Referred in December 2019 for a history of asthma, esophageal dysmotility and lap band surgery  Subjective:   PATIENT ID: Katherine Henry GENDER: female DOB: 12/18/1967, MRN: [id number]   HPI  Chief Complaint  Patient presents with  . New Consult    shortness of breath, non-productive cough, wheezing    Katherine Henry is here to see me for a diagnosis of asthma. > she has had this for many years > in August she had a bad flare up and ended up needing multiple rounds of antibiotics including Levaquin > she had a persistent cough afterwards > In November she got sick again and was treated with Levaquin and prednisone again > she says that in November she was very short of breath > she was not coughing up anything at first > she was given gabapentin for her cough, she feels like it made her swell more > she stopped taking the gabapentin because she didn't feel good about taking it > she started coughing up mucus in November> small green plugs the size of a pencil eraser > she says that her cough worsened and she felt more short of breath > during this time she remained active with work and never took any time off > diagnosed with asthma around 2009 > now taking Spiriva and Breo > when sick she was using albuterol multiple times per day > no obvious sick contacts > feeling better now   > no change in her home environment > has a dog that is 17 years ol > house has a crawl space  She had a lap band placed in 2014. > she had the fluid removed from it 2018 because she was having problems with it > this year they started putting fluid back into it > had a lot of GERD problems in 2018 > no trouble swallowing  OSA > was diagnosed with this in 2014 > uses CPAP nightly, she "loves it"  When she is not sick: > "you can't tell I have it" > she walks 10,000 steps a day > no albuterol use or symptoms of dyspnea > she takes Breo daily, has been on that since Spring 2019 > She had  previously been treated with Spiriva and another controller  She smoked a little as a young adult, total 15 years < 1 pack per week (only smoked with friends when going out drinking)  She has always worked in [employer].  Childhood was normal, no respiratory illnesses.  Her father died of lung cancer.  A few years ago (2016) she had pneumonia and had multiple nodules.  She had a "hilar mass" seen and had a PET scan performed.  The mass eventually went away.    Past Medical History:  Diagnosis Date  . Allergy   . Asthma   . GERD (gastroesophageal reflux disease)   . H/O hiatal hernia   . Morbid obesity ([place])   . Multiple nodules of lung    right lung  . Multiple respiratory allergies   . OSA on CPAP    severe  . Plantar fasciitis    right foot  . Sleep apnea      Family History  Problem Relation Age of Onset  . Cancer Father        cancer  . Lung cancer Father      Social History   Socioeconomic History  . Marital status: Married    Spouse name: Not on file  . Number of children: Not  on file  . Years of education: Not on file  . Highest education level: Not on file  Occupational History  . Not on file  Social Needs  . Financial resource strain: Not on file  . Food insecurity:    Worry: Not on file    Inability: Not on file  . Transportation needs:    Medical: Not on file    Non-medical: Not on file  Tobacco Use  . Smoking status: Former Smoker    Last attempt to quit: 08/24/1992    Years since quitting: 25.4  . Smokeless tobacco: Never Used  Substance and Sexual Activity  . Alcohol use: Yes    Comment: Rare; Holidays  . Drug use: No  . Sexual activity: Not on file  Lifestyle  . Physical activity:    Days per week: Not on file    Minutes per session: Not on file  . Stress: Not on file  Relationships  . Social connections:    Talks on phone: Not on file    Gets together: Not on file    Attends religious service: Not on file    Active member of club or  organization: Not on file    Attends meetings of clubs or organizations: Not on file    Relationship status: Not on file  . Intimate partner violence:    Fear of current or ex partner: Not on file    Emotionally abused: Not on file    Physically abused: Not on file    Forced sexual activity: Not on file  Other Topics Concern  . Not on file  Social History Narrative  . Not on file     Allergies  Allergen Reactions  . Augmentin [Amoxicillin-Pot Clavulanate] Nausea And Vomiting  . Candida Antigen Rash     Outpatient Medications Prior to Visit  Medication Sig Dispense Refill  . albuterol (PROVENTIL HFA;VENTOLIN HFA) 108 (90 BASE) MCG/ACT inhaler Inhale 2 puffs into the lungs every 6 (six) hours as needed for wheezing.    Marland Kitchen albuterol (PROVENTIL, VENTOLIN) (5 MG/ML) 0.5% NEBU Take 1 mL by nebulization 2 (two) times daily.    . Azelastine-Fluticasone (DYMISTA) 137-50 MCG/ACT SUSP Place 1 spray into the nose 2 (two) times daily.    . B-D INS SYR MICROFINE 1CC/28G 28G X 1/2" 1 ML MISC     . calcium carbonate (OS-CAL) 600 MG TABS Take 600 mg by mouth 2 (two) times daily with a meal.    . EPINEPHrine (ADRENALIN) 0.1 MG/ML injection Inject 0.3 mg into the vein once as needed.     . ergocalciferol (VITAMIN D2) 50000 UNITS capsule Take 50,000 Units by mouth every Sunday.     . famotidine (PEPCID) 20 MG tablet Take 20 mg by mouth as needed.     . Fluticasone Furoate-Vilanterol (BREO ELLIPTA IN) Inhale into the lungs daily.    Marland Kitchen levofloxacin (LEVAQUIN) 500 MG tablet as needed.     . loratadine (CLARITIN) 10 MG tablet Take 10 mg by mouth at bedtime.     . mometasone-formoterol (DULERA) 200-5 MCG/ACT AERO Inhale 2 puffs into the lungs 2 (two) times daily.    . montelukast (SINGULAIR) 10 MG tablet Take 10 mg by mouth every morning.    . montelukast (SINGULAIR) 10 MG tablet Take 10 mg by mouth at bedtime.    . Multiple Vitamin (MULTIVITAMIN WITH MINERALS) TABS Take 1 tablet by mouth daily.    .  ondansetron (ZOFRAN ODT) 4 MG disintegrating tablet Take  1 tablet (4 mg total) by mouth every 6 (six) hours as needed for nausea. (Patient not taking: Reported on 01/22/2018) 15 tablet 0  . polyethylene glycol (MIRALAX / GLYCOLAX) packet Take 17 g by mouth daily.    Marland Kitchen PRESCRIPTION MEDICATION ALLERGY INJECTIONS WEEKLY-- SUNDAY    . Probiotic Product (ALIGN PO) Take by mouth daily.    . vitamin B-12 (CYANOCOBALAMIN) 1000 MCG tablet Take 1,000 mcg by mouth daily.    . Wheat Dextrin (BENEFIBER) POWD Take by mouth. Mix 1 tsp daily in water    . zolpidem (AMBIEN) 10 MG tablet Take 10 mg by mouth at bedtime as needed for sleep.     No facility-administered medications prior to visit.     Review of Systems  Constitutional: Negative for fever and weight loss.  HENT: Positive for ear pain and sore throat. Negative for congestion and nosebleeds.   Eyes: Negative for redness.  Respiratory: Positive for cough, shortness of breath and wheezing.   Cardiovascular: Negative for palpitations, leg swelling and PND.  Gastrointestinal: Negative for nausea and vomiting.  Genitourinary: Negative for dysuria.  Skin: Positive for rash.       On abdomen  Neurological: Positive for headaches.  Endo/Heme/Allergies: Bruises/bleeds easily.  Psychiatric/Behavioral: Negative for depression. The patient is nervous/anxious.       Objective:  Physical Exam   Vitals:   02/16/18 0907  BP: 118/80  Pulse: 94  SpO2: 97%  Weight: 222 lb (100.7 kg)  Height: 4' 11.69" (1.516 m)    Gen: obese but well appearing, no acute distress HENT: NCAT, OP clear, neck supple without masses Eyes: PERRL, EOMi Lymph: no cervical lymphadenopathy PULM: Expiratory wheeze in upper lobes bilaterally otherwise clear CV: RRR, no mgr, no JVD GI: BS+, soft, nontender, no hsm Derm: no rash or skin breakdown MSK: normal bulk and tone Neuro: A&Ox4, CN II-XII intact, strength 5/5 in all 4 extremities Psyche: normal mood and  affect   CBC    Component Value Date/Time   WBC 7.7 10/13/2012 1135   RBC 4.68 10/13/2012 1135   HGB 14.0 10/13/2012 1135   HCT 41.3 10/13/2012 1135   PLT 326 10/13/2012 1135   MCV 88.2 10/13/2012 1135   MCH 29.9 10/13/2012 1135   MCHC 33.9 10/13/2012 1135   RDW 12.8 10/13/2012 1135   LYMPHSABS 1.2 10/13/2012 1135   MONOABS 0.8 10/13/2012 1135   EOSABS 0.2 10/13/2012 1135   BASOSABS 0.0 10/13/2012 1135     Chest imaging: 07/2016 CT chest images personally reviewed: pulmonary parenchyma is unremarkable, airways are normal in appearance, however the esophagus is massively dilated with an air-fluid level, LAP-BAND noted 2016 CT chest showed a "postobstructive pneumonia" and right lower lobe infrahilar mass December 2016 CT chest showed complete resolution of the pneumonia, nodules, and mass  Other imaging: Upper GI April 2019 showed a patent lap band, esophageal dysmotility  Exhaled nitric oxide testing: December 2019 while taking Breo 105 ppb  PFT:  Labs:  Path:  Echo:  Heart Catheterization:       Assessment & Plan:   Asthma, unspecified asthma severity, unspecified whether complicated, unspecified whether persistent - Plan: Nitric oxide, Pulmonary function test, DG Chest 2 View, CBC with Differential/Platelet, IgE, Fungus Culture with Smear,  MYCOBACTERIA, CULTURE, WITH FLUOROCHROME SMEAR, Respiratory or Resp and Sputum Culture  OSA on CPAP  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  What is clear to me is that she has had persistent cough and likely bronchitis, though I would like to see the results of her prior work-up and repeat lung function testing so that I can feel comfortable with a diagnosis of asthma.  I strongly worry about the possibility of  repeated aspiration events given her esophageal dysmotility.  For now I think it safest to continue to treat her as if she has severe persistent asthma.  We know that patients who have poorly controlled asthma typically are obese, have lots of gastroesophageal reflux disease.  She certainly would fall into that category.  Plan: Severe asthma with repeated exacerbations: For now continue Breo and Spiriva We will check a complete blood count with differential, serum IgE to look for evidence of allergy driving this Provide [place] with a sample of mucus so that we can tested for atypical organisms We will check a lung function test We will check a chest x-ray We will check an exhaled nitric oxide test We will request records from your prior pulmonologist to review results of lung function testing  We will see you back in 1 to 2 weeks to go over these results    Current Outpatient Medications:  .  albuterol (PROVENTIL HFA;VENTOLIN HFA) 108 (90 BASE) MCG/ACT inhaler, Inhale 2 puffs into the lungs every 6 (six) hours as needed for wheezing., Disp: , Rfl:  .  albuterol (PROVENTIL, VENTOLIN) (5 MG/ML) 0.5% NEBU, Take 1 mL by nebulization 2 (two) times daily., Disp: , Rfl:  .  Azelastine-Fluticasone (DYMISTA) 137-50 MCG/ACT SUSP, Place 1 spray into the nose 2 (two) times daily., Disp: , Rfl:  .  B-D INS SYR MICROFINE 1CC/28G 28G X 1/2" 1 ML MISC, , Disp: , Rfl:  .  calcium carbonate (OS-CAL) 600 MG TABS, Take 600 mg by mouth 2 (two) times daily with a meal., Disp: , Rfl:  .  EPINEPHrine (ADRENALIN) 0.1 MG/ML injection, Inject 0.3 mg into the vein once as needed. , Disp: , Rfl:  .  ergocalciferol (VITAMIN D2) 50000 UNITS capsule, Take 50,000 Units by mouth every Sunday. , Disp: , Rfl:  .  famotidine (PEPCID) 20 MG tablet, Take 20 mg by mouth as needed. , Disp: , Rfl:  .  Fluticasone Furoate-Vilanterol (BREO ELLIPTA IN), Inhale into the lungs daily., Disp: , Rfl:  .  levofloxacin (LEVAQUIN) 500 MG tablet,  as needed. , Disp: , Rfl:  .  loratadine (CLARITIN) 10 MG tablet, Take 10 mg by mouth at bedtime. , Disp: , Rfl:  .  mometasone-formoterol (DULERA) 200-5 MCG/ACT AERO, Inhale 2 puffs into the lungs 2 (two) times daily., Disp: , Rfl:  .  montelukast (SINGULAIR) 10 MG tablet, Take 10 mg by mouth every morning., Disp: , Rfl:  .  montelukast (SINGULAIR) 10 MG tablet, Take 10 mg by mouth at bedtime., Disp: , Rfl:  .  Multiple Vitamin (MULTIVITAMIN WITH MINERALS) TABS, Take 1 tablet by mouth daily., Disp: , Rfl:  .  ondansetron (ZOFRAN ODT) 4 MG disintegrating tablet, Take 1 tablet (4 mg total) by mouth every 6 (six) hours as needed for nausea. (Patient not taking: Reported on 01/22/2018), Disp: 15 tablet, Rfl: 0 .  polyethylene glycol (MIRALAX / GLYCOLAX) packet, Take 17 g by mouth daily., Disp: , Rfl:  .  PRESCRIPTION MEDICATION, ALLERGY INJECTIONS WEEKLY-- SUNDAY, Disp: , Rfl:  .  Probiotic Product (ALIGN PO), Take by mouth daily., Disp: ,  Rfl:  .  vitamin B-12 (CYANOCOBALAMIN) 1000 MCG tablet, Take 1,000 mcg by mouth daily., Disp: , Rfl:  .  Wheat Dextrin (BENEFIBER) POWD, Take by mouth. Mix 1 tsp daily in water, Disp: , Rfl:  .  zolpidem (AMBIEN) 10 MG tablet, Take 10 mg by mouth at bedtime as needed for sleep., Disp: , Rfl:

## 2018-02-16 NOTE — Patient Instructions (Signed)
Severe asthma with repeated exacerbations: For now continue Breo and Spiriva We will check a complete blood count with differential, serum IgE to look for evidence of allergy driving this Provide [place] with a sample of mucus so that we can tested for atypical organisms We will check a lung function test We will check a chest x-ray We will check an exhaled nitric oxide test We will request records from your prior pulmonologist to review results of lung function testing  We will see you back in 1 to 2 weeks to go over these results

## 2018-02-17 LAB — IGE: IgE (Immunoglobulin E), Serum: 22 kU/L (ref <=114)

## 2018-02-22 ENCOUNTER — Encounter: Payer: Self-pay | Admitting: Gastroenterology

## 2018-02-22 ENCOUNTER — Ambulatory Visit (AMBULATORY_SURGERY_CENTER): Payer: Commercial Managed Care - PPO | Admitting: Gastroenterology

## 2018-02-22 VITALS — BP 117/78 | HR 90 | Temp 97.3°F | Resp 13 | Ht 61.0 in | Wt 221.0 lb

## 2018-02-22 DIAGNOSIS — D122 Benign neoplasm of ascending colon: Secondary | ICD-10-CM | POA: Diagnosis not present

## 2018-02-22 DIAGNOSIS — D123 Benign neoplasm of transverse colon: Secondary | ICD-10-CM

## 2018-02-22 DIAGNOSIS — Z1211 Encounter for screening for malignant neoplasm of colon: Secondary | ICD-10-CM | POA: Diagnosis present

## 2018-02-22 MED ORDER — SODIUM CHLORIDE 0.9 % IV SOLN: 500.0000 mL | Freq: Once | INTRAVENOUS | Status: DC

## 2018-02-22 NOTE — Op Note (Signed)
[facility] Patient Name: Katherine Henry Procedure Date: 02/22/2018 9:00 AM MRN: [id number] Endoscopist: Jackquline Denmark , MD Age: 57 Referring MD:  Date of Birth: [birth year] Gender: Female Account #: [id number] Procedure:                Colonoscopy Indications:              Screening for colorectal malignant neoplasm Medicines:                Monitored Anesthesia Care Procedure:                Pre-Anesthesia Assessment:                           - Prior to the procedure, a History and Physical                            was performed, and patient medications and                            allergies were reviewed. The patient's tolerance of                            previous anesthesia was also reviewed. The risks                            and benefits of the procedure and the sedation                            options and risks were discussed with the patient.                            All questions were answered, and informed consent                            was obtained. Prior Anticoagulants: The patient has                            taken no previous anticoagulant or antiplatelet                            agents. ASA Grade Assessment: III - A patient with                            severe systemic disease. After reviewing the risks                            and benefits, the patient was deemed in                            satisfactory condition to undergo the procedure.                           After obtaining informed consent, the colonoscope  was passed under direct vision. Throughout the                            procedure, the patient's blood pressure, pulse, and                            oxygen saturations were monitored continuously. The                            Colonoscope was introduced through the anus and                            advanced to the the cecum, identified by                            appendiceal orifice and  ileocecal valve. The                            colonoscopy was performed without difficulty. The                            patient tolerated the procedure well. The quality                            of the bowel preparation was excellent. Scope In: 9:05:09 AM Scope Out: 9:22:43 AM Scope Withdrawal Time: 0 hours 12 minutes 55 seconds  Total Procedure Duration: 0 hours 17 minutes 34 seconds  Findings:                 Three sessile polyps were found in the proximal                            transverse colon and ascending colon. The polyps                            were 6 to 10 mm in size. Two larger polyps were                            removed with a hot snare and smaller one with cold                            snare. Resection and retrieval were complete.                            Estimated blood loss: none.                           A few small-mouthed diverticula were found in the                            sigmoid colon and ascending colon.                           Non-bleeding internal hemorrhoids were found during  retroflexion. The hemorrhoids were small.                           The exam was otherwise without abnormality on                            direct and retroflexion views. Complications:            No immediate complications. Estimated Blood Loss:     Estimated blood loss: none. Impression:               -Colonic polyps status post polypectomy.                           -Mild pancolonic diverticulosis.                           -Otherwise normal colonoscopy. Recommendation:           - Patient has a contact number available for                            emergencies. The signs and symptoms of potential                            delayed complications were discussed with the                            patient. Return to normal activities tomorrow.                            Written discharge instructions were provided to the                             patient.                           - Resume previous diet.                           - Continue present medications.                           - Await pathology results.                           - Repeat colonoscopy for surveillance based on                            pathology results.                           - Return to GI clinic PRN. Jackquline Denmark, MD 02/22/2018 9:27:55 AM This report has been signed electronically.

## 2018-02-22 NOTE — Patient Instructions (Signed)
Discharge instructions given. Handouts on polyps,diverticulosis and hemorrhoids. Resume previous medications. YOU HAD AN ENDOSCOPIC PROCEDURE TODAY AT THE [facility]:   Refer to the procedure report that was given to you for any specific questions about what was found during the examination.  If the procedure report does not answer your questions, please call your gastroenterologist to clarify.  If you requested that your care partner not be given the details of your procedure findings, then the procedure report has been included in a sealed envelope for you to review at your convenience later.  YOU SHOULD EXPECT: Some feelings of bloating in the abdomen. Passage of more gas than usual.  Walking can help get rid of the air that was put into your GI tract during the procedure and reduce the bloating. If you had a lower endoscopy (such as a colonoscopy or flexible sigmoidoscopy) you may notice spotting of blood in your stool or on the toilet paper. If you underwent a bowel prep for your procedure, you may not have a normal bowel movement for a few days.  Please Note:  You might notice some irritation and congestion in your nose or some drainage.  This is from the oxygen used during your procedure.  There is no need for concern and it should clear up in a day or so.  SYMPTOMS TO REPORT IMMEDIATELY:   Following lower endoscopy (colonoscopy or flexible sigmoidoscopy):  Excessive amounts of blood in the stool  Significant tenderness or worsening of abdominal pains  Swelling of the abdomen that is new, acute  Fever of 100F or higher   For urgent or emergent issues, a gastroenterologist can be reached at any hour by calling [phone].   DIET:  We do recommend a small meal at first, but then you may proceed to your regular diet.  Drink plenty of fluids but you should avoid alcoholic beverages for 24 hours.  ACTIVITY:  You should plan to take it easy for the rest of today and you  should NOT DRIVE or use heavy machinery until tomorrow (because of the sedation medicines used during the test).    FOLLOW UP: Our staff will call the number listed on your records the next business day following your procedure to check on you and address any questions or concerns that you may have regarding the information given to you following your procedure. If we do not reach you, we will leave a message.  However, if you are feeling well and you are not experiencing any problems, there is no need to return our call.  We will assume that you have returned to your regular daily activities without incident.  If any biopsies were taken you will be contacted by phone or by letter within the next 1-3 weeks.  Please call us at [phone] if you have not heard about the biopsies in 3 weeks.    SIGNATURES/CONFIDENTIALITY: You and/or your care partner have signed paperwork which will be entered into your electronic medical record.  These signatures attest to the fact that that the information above on your After Visit Summary has been reviewed and is understood.  Full responsibility of the confidentiality of this discharge information lies with you and/or your care-partner. 

## 2018-02-22 NOTE — Progress Notes (Signed)
Pt's states no medical or surgical changes since previsit or office visit. 

## 2018-02-22 NOTE — Progress Notes (Signed)
Called to room to assist during endoscopic procedure.  Patient ID and intended procedure confirmed with present staff. Received instructions for my participation in the procedure from the performing physician.  

## 2018-02-22 NOTE — Progress Notes (Signed)
Report given to PACU, vss 

## 2018-02-23 ENCOUNTER — Encounter: Payer: Self-pay | Admitting: Nurse Practitioner

## 2018-02-23 ENCOUNTER — Ambulatory Visit (INDEPENDENT_AMBULATORY_CARE_PROVIDER_SITE_OTHER)
Admission: RE | Admit: 2018-02-23 | Discharge: 2018-02-23 | Disposition: A | Discharge status: Home / Self Care | Payer: Commercial Managed Care - PPO | Source: Ambulatory Visit | Attending: Nurse Practitioner | Admitting: Nurse Practitioner

## 2018-02-23 ENCOUNTER — Ambulatory Visit: Payer: Commercial Managed Care - PPO | Admitting: Nurse Practitioner

## 2018-02-23 ENCOUNTER — Telehealth: Payer: Self-pay

## 2018-02-23 VITALS — BP 130/80 | HR 91 | Ht 59.69 in | Wt 224.4 lb

## 2018-02-23 DIAGNOSIS — R0781 Pleurodynia: Secondary | ICD-10-CM

## 2018-02-23 DIAGNOSIS — J069 Acute upper respiratory infection, unspecified: Secondary | ICD-10-CM | POA: Diagnosis not present

## 2018-02-23 DIAGNOSIS — R0600 Dyspnea, unspecified: Secondary | ICD-10-CM

## 2018-02-23 DIAGNOSIS — R0789 Other chest pain: Secondary | ICD-10-CM | POA: Insufficient documentation

## 2018-02-23 MED ORDER — BENZONATATE 100 MG PO CAPS: 100.0000 mg | ORAL_CAPSULE | Freq: Three times a day (TID) | ORAL | 1 refills | Status: DC

## 2018-02-23 MED ORDER — FLUTICASONE FUROATE-VILANTEROL 200-25 MCG/INH IN AEPB: 1.0000 | INHALATION_SPRAY | Freq: Every day | RESPIRATORY_TRACT | 0 refills | Status: DC

## 2018-02-23 MED ORDER — METHYLPREDNISOLONE ACETATE 80 MG/ML IJ SUSP
80.0000 mg | Freq: Once | INTRAMUSCULAR | Status: AC
  Administered 2018-02-23: 80 mg via INTRAMUSCULAR

## 2018-02-23 MED ORDER — LEVALBUTEROL HCL 0.63 MG/3ML IN NEBU
0.6300 mg | INHALATION_SOLUTION | Freq: Once | RESPIRATORY_TRACT | Status: AC
  Administered 2018-02-23: 0.63 mg via RESPIRATORY_TRACT

## 2018-02-23 MED ORDER — PREDNISONE 10 MG PO TABS: ORAL_TABLET | ORAL | 0 refills | Status: DC

## 2018-02-23 MED ORDER — ALPRAZOLAM 0.5 MG PO TABS: 0.5000 mg | ORAL_TABLET | Freq: Two times a day (BID) | ORAL | 0 refills | Status: AC | PRN

## 2018-02-23 NOTE — Assessment & Plan Note (Signed)
Will order x ray and call with results

## 2018-02-23 NOTE — Progress Notes (Signed)
Reviewed, agree 

## 2018-02-23 NOTE — Assessment & Plan Note (Signed)
Asthma exacerbation and seems to have some underlying sinusitis also  Patient Instructions  Continue Breo 200/25 - sample given Continue Spiriva DepoMedrol given in office today Will order prednisone taper to start tomorrow xopenex neb given in office today Ordered a 6 day supply of xanax for anxiety induced by prednisone  Keep already scheduled follow up with Dr. [place] Bells Please call if symptoms worsen

## 2018-02-23 NOTE — Patient Instructions (Addendum)
Continue Breo 200/25 - sample given Continue Spiriva DepoMedrol given in office today Will order prednisone taper to start tomorrow xopenex neb given in office today Ordered a 6 day supply of xanax for anxiety induced by prednisone  Keep already scheduled follow up with Dr. [place] Bells Please call if symptoms worsen

## 2018-02-23 NOTE — Progress Notes (Signed)
@Patient  ID: Katherine Henry, female    DOB: Jul 27, 1960, 57 y.o.   MRN: [id number]  Chief Complaint  Patient presents with  . Shortness of Breath    Referring provider: Caprice Renshaw, MD   HPI 57 year old female former smoker with asthma followed by Dr. Lake Bells.  Tests: Chest imaging: 07/2016 CT chest images personally reviewed: pulmonary parenchyma is unremarkable, airways are normal in appearance, however the esophagus is massively dilated with an air-fluid level, LAP-BAND noted 2016 CT chest showed a "postobstructive pneumonia" and right lower lobe infrahilar mass December 2016 CT chest showed complete resolution of the pneumonia, nodules, and mass  Other imaging: Upper GI April 2019 showed a patent lap band, esophageal dysmotility  Exhaled nitric oxide testing: December 2019 while taking Breo 105 ppb  OV 02/23/18 - acute - shortness of breath/wheezing Patient presents with shortness of breath and wheezing.  She states that wheezing started last Saturday.  Shortness of breath started this morning.  She states that last Saturday her husband was burning leaves in the yard which triggered wheezing and cough.  Her cough has been nonproductive.  She also complains of sore throat and hoarse voice. She denies any fever.  She also complains today of left rib pain.  She states that she has been coughing so hard she is concerned that she has fractured a rib.  She states that she was taking breo 200/25 and ran out so she has been using an old breo inhaler that is 100/25.  She has noticed increased shortness of breath since switching to the lower dose.  She is compliant with Breo and Spiriva. She has been using her DuoNeb 4 times a day.  She denies any fever, chest pain, or edema.    Allergies  Allergen Reactions  . Augmentin [Amoxicillin-Pot Clavulanate] Nausea And Vomiting  . Candida Antigen Rash    Immunization History  Administered Date(s) Administered  . Influenza, Seasonal,  Injecte, Preservative Fre 03/18/2011  . Influenza-Unspecified 01/15/2018  . Tdap 03/17/2005  . Zoster Recombinat (Shingrix) 01/25/2017    Past Medical History:  Diagnosis Date  . Allergy   . Asthma   . GERD (gastroesophageal reflux disease)   . H/O hiatal hernia   . Morbid obesity ([place])   . Multiple nodules of lung    right lung  . Multiple respiratory allergies   . OSA on CPAP    severe  . Plantar fasciitis    right foot  . Sleep apnea     Tobacco History: Social History   Tobacco Use  Smoking Status Former Smoker  . Last attempt to quit: 08/24/1992  . Years since quitting: 25.5  Smokeless Tobacco Never Used   Counseling given: Yes   Outpatient Encounter Medications as of 02/23/2018  Medication Sig  . albuterol (PROVENTIL HFA;VENTOLIN HFA) 108 (90 BASE) MCG/ACT inhaler Inhale 2 puffs into the lungs every 6 (six) hours as needed for wheezing.  Marland Kitchen albuterol (PROVENTIL, VENTOLIN) (5 MG/ML) 0.5% NEBU Take 1 mL by nebulization 2 (two) times daily.  Marland Kitchen ALPRAZolam (XANAX) 0.5 MG tablet Take 0.5 mg by mouth 2 (two) times daily. Only taken when using prednisone.  . Azelastine-Fluticasone (DYMISTA) 137-50 MCG/ACT SUSP Place 1 spray into the nose 2 (two) times daily.  Marland Kitchen EPINEPHrine (ADRENALIN) 0.1 MG/ML injection Inject 0.3 mg into the vein once as needed.   . famotidine (PEPCID) 20 MG tablet Take 20 mg by mouth as needed.   . gabapentin (NEURONTIN) 100 MG capsule 200 mg 3 (three)  times daily. Patient takes 2 capules (200 mg total) three times daily for cough as needed  . ipratropium-albuterol (DUONEB) 0.5-2.5 (3) MG/3ML SOLN Take 1 inhalation 4 times a day as needed for shortness of breath  . loratadine (CLARITIN) 10 MG tablet Take 10 mg by mouth at bedtime.   . montelukast (SINGULAIR) 10 MG tablet Take 10 mg by mouth at bedtime.  . Multiple Vitamin (MULTIVITAMIN WITH MINERALS) TABS Take 1 tablet by mouth daily.  . Probiotic Product (ALIGN PO) Take by mouth daily.  . Tiotropium  Bromide Monohydrate (SPIRIVA RESPIMAT) 1.25 MCG/ACT AERS Inhale 2 puffs into the lungs daily.  . Tiotropium Bromide Monohydrate (SPIRIVA RESPIMAT) 1.25 MCG/ACT AERS Inhale 2 puffs into the lungs daily.  . Wheat Dextrin (BENEFIBER) POWD Take by mouth. Mix 1 tsp daily in water  . zolpidem (AMBIEN) 10 MG tablet Take 10 mg by mouth at bedtime as needed for sleep.  . [DISCONTINUED] Fluticasone Furoate-Vilanterol (BREO ELLIPTA IN) Inhale into the lungs daily.  Marland Kitchen ALPRAZolam (XANAX) 0.5 MG tablet Take 1 tablet (0.5 mg total) by mouth 2 (two) times daily as needed for up to 6 days for anxiety.  . benzonatate (TESSALON) 100 MG capsule Take 1 capsule (100 mg total) by mouth 3 (three) times daily.  . fluticasone furoate-vilanterol (BREO ELLIPTA) 200-25 MCG/INH AEPB Inhale 1 puff into the lungs daily.  . predniSONE (DELTASONE) 10 MG tablet Take 4 tabs for 2 days, then 3 tabs for 2 days, then 2 tabs for 2 days, then 1 tab for 2 days, then stop  . [EXPIRED] levalbuterol (XOPENEX) nebulizer solution 0.63 mg   . [EXPIRED] methylPREDNISolone acetate (DEPO-MEDROL) injection 80 mg    No facility-administered encounter medications on file as of 02/23/2018.      Review of Systems  Review of Systems  Constitutional: Negative.  Negative for fever.  HENT: Positive for postnasal drip and sore throat.   Respiratory: Positive for cough, shortness of breath and wheezing.   Cardiovascular: Negative.  Negative for chest pain, palpitations and leg swelling.  Gastrointestinal: Negative.   Musculoskeletal:       Left rib pain  Allergic/Immunologic: Negative.   Neurological: Negative.   Psychiatric/Behavioral: Negative.        Physical Exam  BP 130/80 (BP Location: Right Arm, Patient Position: Sitting, Cuff Size: Normal)   Pulse 91   Ht 4' 11.69" (1.516 m)   Wt 224 lb 6.4 oz (101.8 kg)   LMP 03/18/1999   SpO2 92%   BMI 44.28 kg/m   Wt Readings from Last 5 Encounters:  02/23/18 224 lb 6.4 oz (101.8 kg)    02/22/18 221 lb (100.2 kg)  02/16/18 222 lb (100.7 kg)  01/22/18 221 lb 6.4 oz (100.4 kg)  10/20/13 178 lb 6.4 oz (80.9 kg)     Physical Exam  Constitutional: She is oriented to person, place, and time. She appears well-developed and well-nourished. No distress.  Cardiovascular: Normal rate and regular rhythm.  Pulmonary/Chest: Effort normal. She has no decreased breath sounds. She has wheezes. She has no rhonchi. She has no rales.      Pinpoint tenderness to left rib area. Muscle tightness noted.   Musculoskeletal:       Right lower leg: She exhibits no edema.  Neurological: She is alert and oriented to person, place, and time.  Psychiatric: She has a normal mood and affect.  Nursing note and vitals reviewed.    Lab Results:  CBC    Component Value Date/Time  WBC 6.7 02/16/2018 1038   RBC 4.67 02/16/2018 1038   HGB 14.1 02/16/2018 1038   HCT 42.0 02/16/2018 1038   PLT 292.0 02/16/2018 1038   MCV 89.9 02/16/2018 1038   MCH 29.9 10/13/2012 1135   MCHC 33.6 02/16/2018 1038   RDW 13.2 02/16/2018 1038   LYMPHSABS 1.2 02/16/2018 1038   MONOABS 0.7 02/16/2018 1038   EOSABS 0.3 02/16/2018 1038   BASOSABS 0.1 02/16/2018 1038    BMET    Component Value Date/Time   NA 138 10/13/2012 1135   K 4.0 10/13/2012 1135   CL 102 10/13/2012 1135   CO2 26 10/13/2012 1135   GLUCOSE 85 10/13/2012 1135   BUN 17 10/13/2012 1135   CREATININE 0.61 10/13/2012 1135   CALCIUM 9.2 10/13/2012 1135   GFRNONAA >90 10/13/2012 1135   GFRAA >90 10/13/2012 1135    BNP No results found for: BNP  ProBNP No results found for: PROBNP  Imaging: Dg Chest 2 View  Result Date: 02/16/2018 CLINICAL DATA:  One month history of dyspnea. History of asthma, former smoker. EXAM: CHEST - 2 VIEW COMPARISON:  CT scan of the chest of Aug 01, 2016 and PA and lateral chest x-ray of August 30, 2012 FINDINGS: The lungs are well-expanded. There is no focal infiltrate. The lung markings are coarse at the left  base. The heart and pulmonary vascularity are normal. The mediastinum is normal in width. The trachea is midline. The bony thorax exhibits no acute abnormality. 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 IMPRESSION: Mild chronic bronchitic-reactive airway changes. Minimal subsegmental atelectasis in the lingula. No alveolar pneumonia. Electronically Signed   By: David  Martinique M.D.   On: 02/16/2018 14:18   Dg Ribs Bilateral W/chest  Result Date: 02/23/2018 CLINICAL DATA:  Left upper anterior rib pain. Cough. Dyspnea. Wheezing. EXAM: BILATERAL RIBS AND CHEST - 4+ VIEW COMPARISON:  Chest x-ray dated 02/16/2018 FINDINGS: No fracture or other bone lesions are seen involving the ribs. There is no evidence of pneumothorax or pleural effusion. Both lungs are clear. Heart size and mediastinal contours are within normal limits. IMPRESSION: Negative. Electronically Signed   By: Lorriane Shire M.D.   On: 02/23/2018 10:55     Assessment & Plan:   Upper respiratory tract infection Asthma exacerbation and seems to have some underlying sinusitis also  Patient Instructions  Continue Breo 200/25 - sample given Continue Spiriva DepoMedrol given in office today Will order prednisone taper to start tomorrow xopenex neb given in office today Ordered a 6 day supply of xanax for anxiety induced by prednisone  Keep already scheduled follow up with Dr. Lake Bells Please call if symptoms worsen     Rib pain on left side Will order x ray and call with results     Fenton Foy, NP 02/23/2018

## 2018-02-23 NOTE — Telephone Encounter (Signed)
  Follow up Call-  Call back number 02/22/2018  Post procedure Call Back phone  # [phone]  Permission to leave phone message Yes  Some recent data might be hidden     Patient questions:  Do you have a fever, pain , or abdominal swelling? No. Pain Score  0 *  Have you tolerated food without any problems? Yes.    Have you been able to return to your normal activities? Yes.    Do you have any questions about your discharge instructions: Diet   No. Medications  No. Follow up visit  No.  Do you have questions or concerns about your Care? No.  Actions: * If pain score is 4 or above: No action needed, pain <4.

## 2018-03-01 ENCOUNTER — Encounter: Payer: Self-pay | Admitting: Gastroenterology

## 2018-03-01 ENCOUNTER — Telehealth: Payer: Self-pay | Admitting: Pulmonary Disease

## 2018-03-01 NOTE — Telephone Encounter (Signed)
Called and spoke with Patient.  She is a BQ pt that saw Kenney Houseman, NP on 12/10.  She stated that she was prescribed a Prednisone taper.  She has finished her Prednisone, but is still wheezing.  She has a cough, that has occasional clear sputum.  Denies any SHOB. She stated that she still had Prednisone left over from a previous exacerbation, and was wondering if she could continue it.  She feels that she needs more Prednisone.  She declined an appointment with a NP.    Will route to Di Kindle, NP to advise

## 2018-03-01 NOTE — Telephone Encounter (Signed)
She can take 20 mg prednisone daily for 5 days. If she does not improve please return for a follow up visit. If she does not have enough prednisone at for this please call it into the pharmacy.

## 2018-03-01 NOTE — Telephone Encounter (Signed)
Per Di Kindle, NP- She can take 20 mg prednisone daily for 5 days. If she does not improve please return for a follow up visit. If she does not have enough prednisone at for this please call it into the pharmacy.  Called and spoke with Patient.  Tonya's recommendations given. Patient stated understanding.  Patient stated that she had enough Prednisone left over from previous prescription to take recommended dose. Stated she would let us know if she needed Prednisone prescription and would schedule follow up if not better in 5 days. Nothing further at this time.

## 2018-03-05 ENCOUNTER — Ambulatory Visit: Payer: Commercial Managed Care - PPO

## 2018-03-05 ENCOUNTER — Encounter: Payer: Self-pay | Admitting: Nurse Practitioner

## 2018-03-05 ENCOUNTER — Ambulatory Visit: Payer: Commercial Managed Care - PPO | Admitting: Nurse Practitioner

## 2018-03-05 DIAGNOSIS — J069 Acute upper respiratory infection, unspecified: Secondary | ICD-10-CM | POA: Diagnosis not present

## 2018-03-05 MED ORDER — LEVOFLOXACIN 750 MG PO TABS: 750.0000 mg | ORAL_TABLET | Freq: Every day | ORAL | 0 refills | Status: DC

## 2018-03-05 MED ORDER — CYCLOBENZAPRINE HCL 5 MG PO TABS: 5.0000 mg | ORAL_TABLET | Freq: Three times a day (TID) | ORAL | 0 refills | Status: DC | PRN

## 2018-03-05 MED ORDER — AZELASTINE-FLUTICASONE 137-50 MCG/ACT NA SUSP: 1.0000 | Freq: Two times a day (BID) | NASAL | 0 refills | Status: DC

## 2018-03-05 NOTE — Patient Instructions (Signed)
Will order Levaquin May continue prednisone at 10 mg daily x 5 days May take mucinex  May take delsym for cough Will order flexeril for muscle spasm in left side Follow up with Dr. Lake Bells as scheduled with PFT before appointment

## 2018-03-05 NOTE — Progress Notes (Signed)
@Patient  ID: Katherine Henry, female    DOB: 1960-08-08, 57 y.o.   MRN: [id number]  Chief Complaint  Patient presents with  . Follow-up    Congestion has changed color since last visit.    Referring provider: Caprice Renshaw, MD  HPI 57 year old female former smoker with asthma followed by Dr. Lake Bells.  Tests: Chest imaging: 02/23/18 - CXR/ribs - negative 07/2016 CT chest images personally reviewed:pulmonary parenchyma is unremarkable, airways are normal in appearance,however the esophagus is massively dilated with an air-fluid level, LAP-BAND noted 2016 CT chest showed a "postobstructive pneumonia" and right lower lobe infrahilar mass December 2016 CT chest showed complete resolution of the pneumonia, nodules, and mass  Other imaging: Upper GI April 2019 showed a patent lap band, esophageal dysmotility  Exhaled nitric oxide testing: December 2019 while taking Breo 105 ppb  OV 03/08/18 - Shortness of breath/wheezing Patient presents with shortness of breath and wheezing.  He was seen by me on 02/23/2018.  She was treated with prednisone taper.  She states that symptoms have not gone away and have since slightly worsened.  Symptoms have been present for more than 2 weeks now.  She states that she has pulled a muscle in her left side from coughing so much.  States that her cough is productive of green sputum.  She states that she has been stressed and is working over 50 hours/week.  She is compliant with medications.  She denies any fever, chest pain, or edema.    Allergies  Allergen Reactions  . Augmentin [Amoxicillin-Pot Clavulanate] Nausea And Vomiting  . Candida Antigen Rash    Immunization History  Administered Date(s) Administered  . Influenza, Seasonal, Injecte, Preservative Fre 03/18/2011  . Influenza-Unspecified 01/15/2018  . Tdap 03/17/2005  . Zoster Recombinat (Shingrix) 01/25/2017    Past Medical History:  Diagnosis Date  . Allergy   . Asthma   .  GERD (gastroesophageal reflux disease)   . H/O hiatal hernia   . Morbid obesity ([place])   . Multiple nodules of lung    right lung  . Multiple respiratory allergies   . OSA on CPAP    severe  . Plantar fasciitis    right foot  . Sleep apnea     Tobacco History: Social History   Tobacco Use  Smoking Status Former Smoker  . Last attempt to quit: 08/24/1992  . Years since quitting: 25.5  Smokeless Tobacco Never Used   Counseling given: Yes   Outpatient Encounter Medications as of 03/05/2018  Medication Sig  . albuterol (PROVENTIL HFA;VENTOLIN HFA) 108 (90 BASE) MCG/ACT inhaler Inhale 2 puffs into the lungs every 6 (six) hours as needed for wheezing.  Marland Kitchen albuterol (PROVENTIL, VENTOLIN) (5 MG/ML) 0.5% NEBU Take 1 mL by nebulization 2 (two) times daily.  Marland Kitchen ALPRAZolam (XANAX) 0.5 MG tablet Take 0.5 mg by mouth 2 (two) times daily. Only taken when using prednisone.  . Azelastine-Fluticasone (DYMISTA) 137-50 MCG/ACT SUSP Place 1 spray into the nose 2 (two) times daily.  . benzonatate (TESSALON) 100 MG capsule Take 1 capsule (100 mg total) by mouth 3 (three) times daily.  Marland Kitchen EPINEPHrine (ADRENALIN) 0.1 MG/ML injection Inject 0.3 mg into the vein once as needed.   . famotidine (PEPCID) 20 MG tablet Take 20 mg by mouth as needed.   . fluticasone furoate-vilanterol (BREO ELLIPTA) 200-25 MCG/INH AEPB Inhale 1 puff into the lungs daily.  Marland Kitchen gabapentin (NEURONTIN) 100 MG capsule 200 mg 3 (three) times daily. Patient takes 2 capules (200 mg  total) three times daily for cough as needed  . ipratropium-albuterol (DUONEB) 0.5-2.5 (3) MG/3ML SOLN Take 1 inhalation 4 times a day as needed for shortness of breath  . loratadine (CLARITIN) 10 MG tablet Take 10 mg by mouth at bedtime.   . montelukast (SINGULAIR) 10 MG tablet Take 10 mg by mouth at bedtime.  . Multiple Vitamin (MULTIVITAMIN WITH MINERALS) TABS Take 1 tablet by mouth daily.  . predniSONE (DELTASONE) 10 MG tablet Take 4 tabs for 2 days, then 3  tabs for 2 days, then 2 tabs for 2 days, then 1 tab for 2 days, then stop  . Probiotic Product (ALIGN PO) Take by mouth daily.  . Tiotropium Bromide Monohydrate (SPIRIVA RESPIMAT) 1.25 MCG/ACT AERS Inhale 2 puffs into the lungs daily.  . Wheat Dextrin (BENEFIBER) POWD Take by mouth. Mix 1 tsp daily in water  . zolpidem (AMBIEN) 10 MG tablet Take 10 mg by mouth at bedtime as needed for sleep.  . [DISCONTINUED] Azelastine-Fluticasone (DYMISTA) 137-50 MCG/ACT SUSP Place 1 spray into the nose 2 (two) times daily.  . cyclobenzaprine (FLEXERIL) 5 MG tablet Take 1 tablet (5 mg total) by mouth 3 (three) times daily as needed for muscle spasms.  Marland Kitchen levofloxacin (LEVAQUIN) 750 MG tablet Take 1 tablet (750 mg total) by mouth daily.  . [DISCONTINUED] Tiotropium Bromide Monohydrate (SPIRIVA RESPIMAT) 1.25 MCG/ACT AERS Inhale 2 puffs into the lungs daily. (Patient not taking: Reported on 03/05/2018)   No facility-administered encounter medications on file as of 03/05/2018.      Review of Systems  Review of Systems  Constitutional: Negative.  Negative for chills and fever.  HENT: Positive for congestion and postnasal drip.   Respiratory: Positive for cough and shortness of breath.   Cardiovascular: Negative.  Negative for chest pain, palpitations and leg swelling.  Gastrointestinal: Negative.   Allergic/Immunologic: Negative.   Neurological: Negative.   Psychiatric/Behavioral: Negative.        Physical Exam  BP 130/74 (BP Location: Right Arm, Patient Position: Sitting, Cuff Size: Normal)   Pulse (!) 104   Temp 97.9 F (36.6 C)   Ht 4' 11.69" (1.516 m)   Wt 219 lb 12.8 oz (99.7 kg)   LMP 03/18/1999   SpO2 93%   BMI 43.37 kg/m   Wt Readings from Last 5 Encounters:  03/05/18 219 lb 12.8 oz (99.7 kg)  02/23/18 224 lb 6.4 oz (101.8 kg)  02/22/18 221 lb (100.2 kg)  02/16/18 222 lb (100.7 kg)  01/22/18 221 lb 6.4 oz (100.4 kg)     Physical Exam Vitals signs and nursing note reviewed.    Constitutional:      General: She is not in acute distress.    Appearance: She is well-developed.  Cardiovascular:     Rate and Rhythm: Normal rate and regular rhythm.  Pulmonary:     Effort: Pulmonary effort is normal. No respiratory distress.     Breath sounds: Rhonchi (bilateral bases) present.  Neurological:     Mental Status: She is alert and oriented to person, place, and time.     Imaging: Dg Chest 2 View  Result Date: 02/16/2018 CLINICAL DATA:  One month history of dyspnea. History of asthma, former smoker. EXAM: CHEST - 2 VIEW COMPARISON:  CT scan of the chest of Aug 01, 2016 and PA and lateral chest x-ray of August 30, 2012 FINDINGS: The lungs are well-expanded. There is no focal infiltrate. The lung markings are coarse at the left base. The heart and pulmonary vascularity  are normal. The mediastinum is normal in width. The trachea is midline. The bony thorax exhibits no acute abnormality. 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 IMPRESSION: Mild chronic bronchitic-reactive airway changes. Minimal subsegmental atelectasis in the lingula. No alveolar pneumonia. Electronically Signed   By: David  Martinique M.D.   On: 02/16/2018 14:18   Dg Ribs Bilateral W/chest  Result Date: 02/23/2018 CLINICAL DATA:  Left upper anterior rib pain. Cough. Dyspnea. Wheezing. EXAM: BILATERAL RIBS AND CHEST - 4+ VIEW COMPARISON:  Chest x-ray dated 02/16/2018 FINDINGS: No fracture or other bone lesions are seen involving the ribs. There is no evidence of pneumothorax or pleural effusion. Both lungs are clear. Heart size and mediastinal contours are within normal limits. IMPRESSION: Negative. Electronically Signed   By: Lorriane Shire M.D.   On: 02/23/2018 10:55     Assessment & Plan:   Upper respiratory tract infection Slow to resolve. Advised patient to take a break from work and rest.  States that she is off this weekend and will rest.  Patient Instructions  Will order  Levaquin May continue prednisone at 10 mg daily x 5 days May take mucinex  May take delsym for cough Will order flexeril for muscle spasm in left side Follow up with Dr. Lake Bells as scheduled with PFT before appointment         Fenton Foy, NP 03/08/2018

## 2018-03-08 ENCOUNTER — Encounter: Payer: Self-pay | Admitting: Nurse Practitioner

## 2018-03-08 ENCOUNTER — Other Ambulatory Visit: Payer: Self-pay | Admitting: Nurse Practitioner

## 2018-03-08 NOTE — Assessment & Plan Note (Signed)
Slow to resolve. Advised patient to take a break from work and rest.  States that she is off this weekend and will rest.  Patient Instructions  Will order Levaquin May continue prednisone at 10 mg daily x 5 days May take mucinex  May take delsym for cough Will order flexeril for muscle spasm in left side Follow up with Dr. Lake Bells as scheduled with PFT before appointment

## 2018-03-09 ENCOUNTER — Ambulatory Visit (INDEPENDENT_AMBULATORY_CARE_PROVIDER_SITE_OTHER): Payer: Commercial Managed Care - PPO | Admitting: Pulmonary Disease

## 2018-03-09 DIAGNOSIS — J45909 Unspecified asthma, uncomplicated: Secondary | ICD-10-CM | POA: Diagnosis not present

## 2018-03-09 LAB — PULMONARY FUNCTION TEST
DL/VA % pred: 128 %
DL/VA: 5.63 ml/min/mmHg/L
DLCO UNC: 26.05 ml/min/mmHg
DLCO cor % pred: 126 %
DLCO cor: 25.52 ml/min/mmHg
DLCO unc % pred: 128 %
FEF 25-75 PRE: 1.35 L/s
FEF 25-75 Post: 1.4 L/sec
FEF2575-%Change-Post: 3 %
FEF2575-%Pred-Post: 59 %
FEF2575-%Pred-Pre: 57 %
FEV1-%Change-Post: -9 %
FEV1-%Pred-Post: 78 %
FEV1-%Pred-Pre: 86 %
FEV1-PRE: 2.07 L
FEV1-Post: 1.87 L
FEV1FVC-%Change-Post: -7 %
FEV1FVC-%Pred-Pre: 91 %
FEV6-%Change-Post: -5 %
FEV6-%PRED-POST: 91 %
FEV6-%Pred-Pre: 96 %
FEV6-Post: 2.69 L
FEV6-Pre: 2.85 L
FEV6FVC-%PRED-POST: 103 %
FEV6FVC-%Pred-Pre: 103 %
FVC-%Change-Post: -2 %
FVC-%Pred-Post: 91 %
FVC-%Pred-Pre: 93 %
FVC-Post: 2.8 L
FVC-Pre: 2.86 L
Post FEV1/FVC ratio: 67 %
Post FEV6/FVC ratio: 100 %
Pre FEV1/FVC ratio: 72 %
Pre FEV6/FVC Ratio: 100 %
RV % pred: 110 %
RV: 1.95 L
TLC % PRED: 111 %
TLC: 5.14 L

## 2018-03-09 NOTE — Progress Notes (Signed)
Patient completed full PFT today. 

## 2018-03-17 NOTE — Progress Notes (Signed)
Reviewed, agree 

## 2018-03-23 ENCOUNTER — Ambulatory Visit: Payer: Commercial Managed Care - PPO | Admitting: Pulmonary Disease

## 2018-03-23 ENCOUNTER — Encounter: Payer: Self-pay | Admitting: Pulmonary Disease

## 2018-03-23 VITALS — BP 128/76 | HR 78 | Ht 61.0 in | Wt 225.0 lb

## 2018-03-23 DIAGNOSIS — J069 Acute upper respiratory infection, unspecified: Secondary | ICD-10-CM | POA: Diagnosis not present

## 2018-03-23 DIAGNOSIS — J4550 Severe persistent asthma, uncomplicated: Secondary | ICD-10-CM | POA: Diagnosis not present

## 2018-03-23 MED ORDER — FLUTICASONE FUROATE-VILANTEROL 200-25 MCG/INH IN AEPB: 1.0000 | INHALATION_SPRAY | Freq: Every day | RESPIRATORY_TRACT | 5 refills | Status: DC

## 2018-03-23 MED ORDER — TIOTROPIUM BROMIDE MONOHYDRATE 1.25 MCG/ACT IN AERS: 2.0000 | INHALATION_SPRAY | Freq: Every day | RESPIRATORY_TRACT | 5 refills | Status: DC

## 2018-03-23 NOTE — Progress Notes (Signed)
Synopsis: Referred in December 2019 for a history of asthma, esophageal dysmotility and lap band surgery  Subjective:   PATIENT ID: Katherine Henry GENDER: female DOB: 1960-03-20, MRN: [id number]   HPI  Chief Complaint  Patient presents with  . Follow-up    review PFT.  pt states she is doing well, has occasional prod cough with tan colored mucus.  denies any other complaints currently.     [name] had an asthma flare up after I saw her in December.  She required two doses of prednisone from our office and is now better.  She had the fluid removed from her lap band.  She has been active at work and is walking 10,000 steps a day.   She continues to take [place] daily and Spiriva daily.  She has not had to use albuterol since she last completed her most recent course of prednisone.  She says that she has gained weight since she had fluid removed from her lap band.  She currently feels well.  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 Allergy   . Asthma   . GERD (gastroesophageal reflux disease)   . H/O hiatal hernia   . Morbid obesity ([place])   . Multiple nodules of lung    right lung  . Multiple respiratory allergies   . OSA on CPAP    severe  . Plantar fasciitis    right foot  . Sleep apnea      Family History  Problem Relation Age of Onset  . Cancer Father        cancer  . Lung cancer Father      Review of Systems  Constitutional: Negative for fever and weight loss.  HENT: Positive for ear pain and sore throat. Negative for congestion and nosebleeds.   Eyes: Negative for redness.  Respiratory: Positive for cough, shortness of breath and wheezing.   Cardiovascular: Negative for palpitations, leg swelling and PND.  Gastrointestinal: Negative for nausea and vomiting.  Genitourinary: Negative for dysuria.  Skin: Positive for rash.       On abdomen  Neurological: Positive for headaches.    Endo/Heme/Allergies: Bruises/bleeds easily.  Psychiatric/Behavioral: Negative for depression. The patient is nervous/anxious.       Objective:  Physical Exam   Vitals:   03/23/18 1018  BP: 128/76  Pulse: 78  SpO2: 100%  Weight: 225 lb (102.1 kg)  Height: 5\' 1"  (1.549 m)    Gen: well appearing HENT: OP clear, TM's clear, neck supple PULM: CTA B, normal percussion CV: RRR, no mgr, trace edema GI: BS+, soft, nontender Derm: no cyanosis or rash Psyche: normal mood and affect   CBC    Component Value Date/Time   WBC 6.7 02/16/2018 1038   RBC 4.67 02/16/2018 1038   HGB 14.1 02/16/2018 1038   HCT 42.0 02/16/2018 1038   PLT 292.0 02/16/2018 1038   MCV 89.9 02/16/2018 1038   MCH 29.9 10/13/2012 1135   MCHC 33.6 02/16/2018 1038   RDW 13.2 02/16/2018 1038   LYMPHSABS 1.2 02/16/2018 1038   MONOABS 0.7 02/16/2018 1038   EOSABS 0.3 02/16/2018 1038   BASOSABS 0.1 02/16/2018 1038     Chest imaging: 07/2016 CT chest images personally reviewed: pulmonary parenchyma is unremarkable, airways are normal in appearance, however the esophagus is massively dilated with an air-fluid level, LAP-BAND noted 2016 CT chest showed a "postobstructive pneumonia"  and right lower lobe infrahilar mass December 2016 CT chest showed complete resolution of the pneumonia, nodules, and mass  Other imaging: Upper GI April 2019 showed a patent lap band, esophageal dysmotility  Exhaled nitric oxide testing: December 2019 while taking Breo 105 ppb  PFT: December 2019 PFT ratio 67%, FEV1 1.9 L 78% predicted, TLC 5.1 L 111% predicted, residual volume 110% predicted, DLCO 26.1 mL 128%.  Labs: December 2019 IgE normal, serum eosinophil count 300 cells per deciliter  Path:  Echo:  Heart Catheterization:       Assessment & Plan:   Upper respiratory tract infection, unspecified type  Severe persistent allergic asthma  Discussion: [name] has severe persistent asthma with recurrent  exacerbations and serum eosinophilia as well as an elevated exhaled nitric oxide.  So she has a severe phenotype of allergic asthma and has recurrent exacerbations.  She required 2 rounds of prednisone after we saw her back in December.  Today we talked about the fact that immunotherapy or allergy shots have not been effective for her in the past.  So at this time I think the best approach is to consider biologic treatment with dupilumab, and inhibitor of IL 4/13.  We talked about the risks and benefits and she is willing to proceed.  Plan: Severe persistent asthma with recurrent exacerbations and allergic features with elevated serum eosinophil count: We will start a medicine called dupilumab 600 mg injections every 14 days Continue Breo Continue Spiriva Continue albuterol as needed Keep practicing good hand hygiene Stay active  History of lap band surgery: I am hopeful that after taking dupilumab your symptoms will improve and you can consider having fluid put back into the LAP-BAND.  We will see you back in 6 weeks or sooner if needed    Current Outpatient Medications:  .  albuterol (PROVENTIL HFA;VENTOLIN HFA) 108 (90 BASE) MCG/ACT inhaler, Inhale 2 puffs into the lungs every 6 (six) hours as needed for wheezing., Disp: , Rfl:  .  albuterol (PROVENTIL, VENTOLIN) (5 MG/ML) 0.5% NEBU, Take 1 mL by nebulization 2 (two) times daily., Disp: , Rfl:  .  ALPRAZolam (XANAX) 0.5 MG tablet, Take 0.5 mg by mouth 2 (two) times daily. Only taken when using prednisone., Disp: , Rfl: 2 .  Azelastine-Fluticasone (DYMISTA) 137-50 MCG/ACT SUSP, Place 1 spray into the nose 2 (two) times daily., Disp: 1 Bottle, Rfl: 0 .  benzonatate (TESSALON) 100 MG capsule, Take 1 capsule (100 mg total) by mouth 3 (three) times daily., Disp: 30 capsule, Rfl: 1 .  cyclobenzaprine (FLEXERIL) 5 MG tablet, Take 1 tablet (5 mg total) by mouth 3 (three) times daily as needed for muscle spasms., Disp: 30 tablet, Rfl: 0 .   EPINEPHrine (ADRENALIN) 0.1 MG/ML injection, Inject 0.3 mg into the vein once as needed. , Disp: , Rfl:  .  famotidine (PEPCID) 20 MG tablet, Take 20 mg by mouth as needed. , Disp: , Rfl:  .  fluticasone furoate-vilanterol (BREO ELLIPTA) 200-25 MCG/INH AEPB, Inhale 1 puff into the lungs daily., Disp: 1 each, Rfl: 5 .  gabapentin (NEURONTIN) 100 MG capsule, 200 mg 3 (three) times daily. Patient takes 2 capules (200 mg total) three times daily for cough as needed, Disp: , Rfl: 0 .  ipratropium-albuterol (DUONEB) 0.5-2.5 (3) MG/3ML SOLN, Take 1 inhalation 4 times a day as needed for shortness of breath, Disp: , Rfl: 3 .  loratadine (CLARITIN) 10 MG tablet, Take 10 mg by mouth at bedtime. , Disp: , Rfl:  .  montelukast (SINGULAIR) 10 MG tablet, Take 10 mg by mouth at bedtime., Disp: , Rfl:  .  Multiple Vitamin (MULTIVITAMIN WITH MINERALS) TABS, Take 1 tablet by mouth daily., Disp: , Rfl:  .  Probiotic Product (ALIGN PO), Take by mouth daily., Disp: , Rfl:  .  Tiotropium Bromide Monohydrate (SPIRIVA RESPIMAT) 1.25 MCG/ACT AERS, Inhale 2 puffs into the lungs daily., Disp: 1 Inhaler, Rfl: 5 .  Wheat Dextrin (BENEFIBER) POWD, Take by mouth. Mix 1 tsp daily in water, Disp: , Rfl:  .  zolpidem (AMBIEN) 10 MG tablet, Take 10 mg by mouth at bedtime as needed for sleep., Disp: , Rfl:

## 2018-03-23 NOTE — Patient Instructions (Signed)
Severe persistent asthma with recurrent exacerbations and allergic features with elevated serum eosinophil count: We will start a medicine called dupilumab 600 mg injections every 14 days Continue Breo Continue Spiriva Continue albuterol as needed Keep practicing good hand hygiene Stay active  History of lap band surgery: I am hopeful that after taking dupilumab your symptoms will improve and you can consider having fluid put back into the LAP-BAND.  We will see you back in 6 weeks or sooner if needed

## 2018-04-02 ENCOUNTER — Telehealth: Payer: Self-pay | Admitting: Pulmonary Disease

## 2018-04-05 NOTE — Telephone Encounter (Signed)
Pt want's to know the status of the Duixent inj [phone] please leave a detailed message

## 2018-04-06 NOTE — Telephone Encounter (Signed)
Waiting on documentation and follow up from TS.

## 2018-04-06 NOTE — Telephone Encounter (Addendum)
I am helping to assist Alroy Bailiff with the backlog of PA's for injectable meds today.  There is no documentation of anything regarding this pt's PA for Dupixent, despite having a folder with enrollment form on file.    Called [employer] [facility] at [phone] to check status: per rep, they do not have this patient on file.  Upon further inspection, it looks as if the enrollment form was only faxed to our office from the patient, but never completed and faxed to [facility] from our office.    Form has been completed and faxed to [facility] at [phone].  Patient called about this several days ago and never received a call back, despite our protocol to call patients back every day.  Spoke with pt, aware of application status.    Pt would like to start with a sample of dupixent if this is available.  Routing back to [employer] for follow-up.  Tammy, please arrange for pt to receive initial sample of dupixent while we wait for insurance approval if we have a sample to offer.  Thanks!

## 2018-04-08 ENCOUNTER — Telehealth: Payer: Self-pay | Admitting: Pulmonary Disease

## 2018-04-08 NOTE — Telephone Encounter (Signed)
Will route to Tammy S, for updates

## 2018-04-08 NOTE — Telephone Encounter (Signed)
Hey BQ, pt would like to start on a sample of Dupixent. We haven't received her summary of benefits yet. We have a sample we can give her. Please advise.

## 2018-04-08 NOTE — Telephone Encounter (Signed)
Called pt to let her know I sent BQ a staff message a/b her wanting to start with a sample. Will wait to hear from BQ.

## 2018-04-09 NOTE — Telephone Encounter (Signed)
Patient returned call, scheduled first Dupixent inj for 04/16/2018 at 1:30 pm.  Patient is aware of 2 hour wait and that she must bring Epi-pen.  No call back is needed.

## 2018-04-09 NOTE — Telephone Encounter (Signed)
Yes

## 2018-04-09 NOTE — Telephone Encounter (Signed)
BQ said to start pt. On a Dupixent sample. I called pt, lmom to call back and ask for me so I could schedule her 2 hr appt. And to make sure she is aware to bring her Epi-pen in with her, when she comes in for injection.  Pt. Called back I was with a pt.Wilburn Cornelia made the appt. Shelby sent me a note saying: pt is aware of 2hr. Wait and to bring her Epi-Pen. Nothing further needed.

## 2018-04-09 NOTE — Telephone Encounter (Signed)
I called the pt and asked her to call me back to schedule her 1st inj. Appt.. I also made her aware of the 2 hr. Wait and to bring her Epi-Pen with her ea. Time she gets a shot. Waiting for pt to call me back.

## 2018-04-09 NOTE — Telephone Encounter (Signed)
Thanks for letting me know [place]. Closing encounter, nothing further needed.

## 2018-04-09 NOTE — Telephone Encounter (Signed)
Thank you, I will call the pt. And set up an appt. For her to come in for her 1st Dupixent injection.

## 2018-04-12 NOTE — Telephone Encounter (Signed)
Per Dupixent benefits fax, Dupixent is covered by pt's insurance at a $150/month copay.  Pt has already been enrolled with [employer] [facility] copay assistance.  Called [employer] [facility] to verify the fax disclosing benefits verification- rep states that a PA is needed through [facility] before any copay assistance can be processed for pt.    Called [employer] to initiate PA, was advised that this needed to be completed on a specific form and faxed back with clinical info.  Will await PA to be faxed to office.    Routing back to [employer] for follow-up.

## 2018-04-12 NOTE — Telephone Encounter (Signed)
Fax received for PA.  This has been filled out completely and faxed back as requested.  Confirmation fax received.  Routing back to [employer] for follow-up.

## 2018-04-13 ENCOUNTER — Ambulatory Visit: Payer: Commercial Managed Care - PPO

## 2018-04-16 ENCOUNTER — Ambulatory Visit (INDEPENDENT_AMBULATORY_CARE_PROVIDER_SITE_OTHER): Payer: Commercial Managed Care - PPO

## 2018-04-16 DIAGNOSIS — J45909 Unspecified asthma, uncomplicated: Secondary | ICD-10-CM | POA: Diagnosis not present

## 2018-04-19 ENCOUNTER — Telehealth: Payer: Self-pay | Admitting: Pulmonary Disease

## 2018-04-19 NOTE — Telephone Encounter (Signed)
Pt is aware that I will speak with [employer] [facility] in the morning and will call her back with updates.

## 2018-04-20 NOTE — Telephone Encounter (Signed)
Waiting on documentation from [place].

## 2018-04-21 MED ORDER — DUPILUMAB 300 MG/2ML ~~LOC~~ SOSY
600.0000 mg | PREFILLED_SYRINGE | Freq: Once | SUBCUTANEOUS | Status: AC
  Administered 2018-04-16: 600 mg via SUBCUTANEOUS

## 2018-04-22 NOTE — Telephone Encounter (Signed)
Pt returned my call-she is aware of below.

## 2018-04-22 NOTE — Telephone Encounter (Signed)
LMTCB- Pt needed a new PA through the correct company; I have faxed all PA information to [facility] at [phone] as an expedited case. Should have approval/denial within 24 hours.

## 2018-04-22 NOTE — Telephone Encounter (Signed)
Joellen Jersey do you have any updates?

## 2018-04-23 ENCOUNTER — Telehealth: Payer: Self-pay | Admitting: Pulmonary Disease

## 2018-04-23 NOTE — Telephone Encounter (Signed)
Prior Auth has been approved for Dupixent 300mg /66ml; approved form 04-22-2018 through 07-20-2018. [facility] [phone]. I have called pharmacy and gave verbal Rx on file. Shipment scheduled.   1 prefilled syringe Ordered Date: 04/23/18  Shipping Date: unsure as patient is needing to speak with pharmacy   Pt is aware to call [facility] [facility] today and get her account set up-if copay is too high patient will call me back to get assistance from [employer].

## 2018-04-26 NOTE — Telephone Encounter (Signed)
Patient called insurance needs prior authorization for dupixent shot.  Insurance phone number is [phone] [employer].  Patient is scheduled for shot 04/30/2018.  Patient phone number is [phone].

## 2018-04-26 NOTE — Telephone Encounter (Signed)
We have a P/A that's good now thru 07/20/2018. The rep I spoke to transferred me to the p/a dept. I was on hold and needed to make another ph. Call. I'll call back 04/27/2018.Marland Kitchen

## 2018-04-28 NOTE — Telephone Encounter (Signed)
Tammy please advise on update. Thank you.

## 2018-04-28 NOTE — Telephone Encounter (Signed)
Called and spoke with patient, patient stated that no one has called her in regards to information on her injections. Patient is requesting a call back as she stated that she will not come to appointment until she has her information. TS please advise, thank you.

## 2018-04-28 NOTE — Telephone Encounter (Signed)
Pt is calling back about the dipixent inj [phone]

## 2018-04-29 NOTE — Telephone Encounter (Signed)
Ryan from [employer] my way is calling about the prior authorization for [place]. [phone]

## 2018-04-29 NOTE — Telephone Encounter (Signed)
Spoke with pt. We do not have her Dupixent. I will ask Joellen Jersey or [facility] Pickup 04/29/2018. Will call pt. Back in the morning as soon as I find out.

## 2018-04-29 NOTE — Telephone Encounter (Signed)
Tammy please advise if this has been taken care of. Thanks.

## 2018-04-30 ENCOUNTER — Ambulatory Visit (INDEPENDENT_AMBULATORY_CARE_PROVIDER_SITE_OTHER): Payer: Commercial Managed Care - PPO

## 2018-04-30 DIAGNOSIS — J4550 Severe persistent asthma, uncomplicated: Secondary | ICD-10-CM | POA: Diagnosis not present

## 2018-04-30 NOTE — Telephone Encounter (Signed)
[facility] [facility] just needs a copy of pt's approval, Done.

## 2018-05-03 NOTE — Telephone Encounter (Signed)
Katherine Henry, any updates? 

## 2018-05-03 NOTE — Telephone Encounter (Signed)
Per Joellen Jersey I gave pt a sample Fri.. Nothing further needed.

## 2018-05-04 MED ORDER — DUPILUMAB 300 MG/2ML ~~LOC~~ SOSY
300.0000 mg | PREFILLED_SYRINGE | Freq: Once | SUBCUTANEOUS | Status: AC
  Administered 2018-04-30: 300 mg via SUBCUTANEOUS

## 2018-05-04 NOTE — Progress Notes (Signed)
Dupixent  injection documentation and charges entered by Maury Dus, RMA, based on injection sheet filled out by [facility] during preparation and administration. This documentation process is due to office requirements.

## 2018-05-04 NOTE — Telephone Encounter (Signed)
Tammy please advise if anything further is needed. Thanks.

## 2018-05-04 NOTE — Telephone Encounter (Signed)
See telephone encounter (04/23/2018)

## 2018-05-05 NOTE — Telephone Encounter (Addendum)
Will send [employer] a copy of pt's approval 05/06/2018.

## 2018-05-14 ENCOUNTER — Encounter: Payer: Self-pay | Admitting: Pulmonary Disease

## 2018-05-14 ENCOUNTER — Ambulatory Visit: Payer: Commercial Managed Care - PPO

## 2018-05-14 ENCOUNTER — Ambulatory Visit: Payer: Commercial Managed Care - PPO | Admitting: Pulmonary Disease

## 2018-05-14 VITALS — BP 120/68 | HR 79 | Ht 61.0 in | Wt 224.2 lb

## 2018-05-14 DIAGNOSIS — J45909 Unspecified asthma, uncomplicated: Secondary | ICD-10-CM | POA: Diagnosis not present

## 2018-05-14 DIAGNOSIS — J4550 Severe persistent asthma, uncomplicated: Secondary | ICD-10-CM

## 2018-05-14 NOTE — Patient Instructions (Signed)
Severe persistent asthma: Continue Dupixent injections Continue taking Breo Continue montelukast Continue taking Spiriva, though if you want to stop taking this in 6 weeks I am okay with that if you have not had problems with your breathing Continue albuterol as needed for chest tightness wheezing or shortness of breath  Allergic rhinitis: Continue Dymista  We will see you back in 3 months or sooner if needed

## 2018-05-14 NOTE — Progress Notes (Signed)
Synopsis: Referred in December 2019 for a history of asthma, esophageal dysmotility and lap band surgery  Subjective:   PATIENT ID: Katherine Henry GENDER: female DOB: 1961/01/21, MRN: [id number]   HPI  Chief Complaint  Patient presents with  . Follow-up    asthma-no flare ups doing well on Breo and Spiriva Respimat   Doing better  Still has a productive cough, white to yellow in color.  TYpically happens in the mornign and sometimes later in the day. No prednisone since the last visit.  She has taken three doses of Dupixent, she is receiving copay assistance from her insurance company. She is still taking Spiriva and Breo.   Past Medical History:  Diagnosis Date  . Allergy   . Asthma   . GERD (gastroesophageal reflux disease)   . H/O hiatal hernia   . Morbid obesity ([place])   . Multiple nodules of lung    right lung  . Multiple respiratory allergies   . OSA on CPAP    severe  . Plantar fasciitis    right foot  . Sleep apnea      Family History  Problem Relation Age of Onset  . Cancer Father        cancer  . Lung cancer Father      Review of Systems  Constitutional: Negative for fever and weight loss.  HENT: Positive for ear pain and sore throat. Negative for congestion and nosebleeds.   Eyes: Negative for redness.  Respiratory: Positive for cough, shortness of breath and wheezing.   Cardiovascular: Negative for palpitations, leg swelling and PND.  Gastrointestinal: Negative for nausea and vomiting.  Genitourinary: Negative for dysuria.  Skin: Positive for rash.       On abdomen  Neurological: Positive for headaches.  Endo/Heme/Allergies: Bruises/bleeds easily.  Psychiatric/Behavioral: Negative for depression. The patient is nervous/anxious.       Objective:  Physical Exam   Vitals:   05/14/18 0856  BP: 120/68  Pulse: 79  SpO2: 95%  Weight: 224 lb 3.2 oz (101.7 kg)  Height: 5\' 1"  (1.549 m)    Gen: well appearing HENT: OP clear, TM's clear, neck  supple PULM: CTA B, normal percussion CV: RRR, no mgr, trace edema GI: BS+, soft, nontender Derm: no cyanosis or rash Psyche: normal mood and affect   CBC    Component Value Date/Time   WBC 6.7 02/16/2018 1038   RBC 4.67 02/16/2018 1038   HGB 14.1 02/16/2018 1038   HCT 42.0 02/16/2018 1038   PLT 292.0 02/16/2018 1038   MCV 89.9 02/16/2018 1038   MCH 29.9 10/13/2012 1135   MCHC 33.6 02/16/2018 1038   RDW 13.2 02/16/2018 1038   LYMPHSABS 1.2 02/16/2018 1038   MONOABS 0.7 02/16/2018 1038   EOSABS 0.3 02/16/2018 1038   BASOSABS 0.1 02/16/2018 1038     Chest imaging: 07/2016 CT chest images personally reviewed: pulmonary parenchyma is unremarkable, airways are normal in appearance, however the esophagus is massively dilated with an air-fluid level, LAP-BAND noted 2016 CT chest showed a "postobstructive pneumonia" and right lower lobe infrahilar mass December 2016 CT chest showed complete resolution of the pneumonia, nodules, and mass  Other imaging: Upper GI April 2019 showed a patent lap band, esophageal dysmotility  Exhaled nitric oxide testing: December 2019 while taking Breo 105 ppb  PFT: December 2019 PFT ratio 67%, FEV1 1.9 L 78% predicted, TLC 5.1 L 111% predicted, residual volume 110% predicted, DLCO 26.1 mL 128%.  Labs: December 2019 IgE normal,  serum eosinophil count 300 cells per deciliter  Path:  Echo:  Heart Catheterization:       Assessment & Plan:   Severe persistent allergic asthma  Asthma, unspecified asthma severity, unspecified whether complicated, unspecified whether persistent  Discussion: This has been a stable interval for Edwin now that we started Adin.  She has not had an exacerbation since the last visit and in general she is doing quite well.  Plan: Severe persistent asthma: Continue Dupixent injections Continue taking Breo Continue montelukast Continue taking Spiriva, though if you want to stop taking this in 6 weeks I am  okay with that if you have not had problems with your breathing Continue albuterol as needed for chest tightness wheezing or shortness of breath  Allergic rhinitis: Continue Dymista  We will see you back in 3 months or sooner if needed    Current Outpatient Medications:  .  albuterol (PROVENTIL HFA;VENTOLIN HFA) 108 (90 BASE) MCG/ACT inhaler, Inhale 2 puffs into the lungs every 6 (six) hours as needed for wheezing., Disp: , Rfl:  .  albuterol (PROVENTIL, VENTOLIN) (5 MG/ML) 0.5% NEBU, Take 1 mL by nebulization 2 (two) times daily., Disp: , Rfl:  .  ALPRAZolam (XANAX) 0.5 MG tablet, Take 0.5 mg by mouth 2 (two) times daily. Only taken when using prednisone., Disp: , Rfl: 2 .  Azelastine-Fluticasone (DYMISTA) 137-50 MCG/ACT SUSP, Place 1 spray into the nose 2 (two) times daily., Disp: 1 Bottle, Rfl: 0 .  benzonatate (TESSALON) 100 MG capsule, Take 1 capsule (100 mg total) by mouth 3 (three) times daily., Disp: 30 capsule, Rfl: 1 .  cyclobenzaprine (FLEXERIL) 5 MG tablet, Take 1 tablet (5 mg total) by mouth 3 (three) times daily as needed for muscle spasms., Disp: 30 tablet, Rfl: 0 .  Dupilumab (DUPIXENT) 300 MG/2ML SOSY, Inject 300 mg into the skin. Takes every other week, Disp: , Rfl:  .  EPINEPHrine (ADRENALIN) 0.1 MG/ML injection, Inject 0.3 mg into the vein once as needed. , Disp: , Rfl:  .  famotidine (PEPCID) 20 MG tablet, Take 20 mg by mouth as needed. , Disp: , Rfl:  .  fluticasone furoate-vilanterol (BREO ELLIPTA) 200-25 MCG/INH AEPB, Inhale 1 puff into the lungs daily., Disp: 1 each, Rfl: 5 .  gabapentin (NEURONTIN) 100 MG capsule, 200 mg 3 (three) times daily. Patient takes 2 capules (200 mg total) three times daily for cough as needed, Disp: , Rfl: 0 .  ipratropium-albuterol (DUONEB) 0.5-2.5 (3) MG/3ML SOLN, Take 1 inhalation 4 times a day as needed for shortness of breath, Disp: , Rfl: 3 .  loratadine (CLARITIN) 10 MG tablet, Take 10 mg by mouth at bedtime. , Disp: , Rfl:  .   montelukast (SINGULAIR) 10 MG tablet, Take 10 mg by mouth at bedtime., Disp: , Rfl:  .  Multiple Vitamin (MULTIVITAMIN WITH MINERALS) TABS, Take 1 tablet by mouth daily., Disp: , Rfl:  .  Probiotic Product (ALIGN PO), Take by mouth daily., Disp: , Rfl:  .  Tiotropium Bromide Monohydrate (SPIRIVA RESPIMAT) 1.25 MCG/ACT AERS, Inhale 2 puffs into the lungs daily., Disp: 1 Inhaler, Rfl: 5 .  Wheat Dextrin (BENEFIBER) POWD, Take by mouth. Mix 1 tsp daily in water, Disp: , Rfl:  .  zolpidem (AMBIEN) 10 MG tablet, Take 10 mg by mouth at bedtime as needed for sleep., Disp: , Rfl:

## 2018-05-20 NOTE — Telephone Encounter (Signed)
PA was approved. Please refer to [facility] ph. note on 04/23/2018 for updates.

## 2018-05-22 ENCOUNTER — Other Ambulatory Visit: Payer: Self-pay | Admitting: Pulmonary Disease

## 2018-07-01 ENCOUNTER — Encounter: Payer: Self-pay | Admitting: Pulmonary Disease

## 2018-07-21 ENCOUNTER — Telehealth: Payer: Self-pay | Admitting: Pulmonary Disease

## 2018-07-21 NOTE — Telephone Encounter (Signed)
Per our records, the pt's Katherine Henry expired on 07/20/2018. A new renewal Henry will need to be done. Henry form has been faxed to [facility] at [phone]. Will await Henry decision.

## 2018-07-21 NOTE — Telephone Encounter (Signed)
Received a fax from [facility]. PA has been approved >> 07/21/2018 - 07/21/2019.

## 2018-07-29 ENCOUNTER — Telehealth: Payer: Self-pay | Admitting: Pulmonary Disease

## 2018-07-29 MED ORDER — MONTELUKAST SODIUM 10 MG PO TABS: 10.0000 mg | ORAL_TABLET | Freq: Every day | ORAL | 1 refills | Status: DC

## 2018-07-29 NOTE — Telephone Encounter (Signed)
Returned call to patient to make aware rx sent. Pt request 90 rx Nothing further needed.

## 2018-08-06 ENCOUNTER — Other Ambulatory Visit: Payer: Self-pay | Admitting: Pulmonary Disease

## 2018-08-13 ENCOUNTER — Ambulatory Visit: Payer: Commercial Managed Care - PPO | Admitting: Pulmonary Disease

## 2018-08-13 ENCOUNTER — Other Ambulatory Visit: Payer: Self-pay

## 2018-08-13 ENCOUNTER — Ambulatory Visit (INDEPENDENT_AMBULATORY_CARE_PROVIDER_SITE_OTHER): Payer: Commercial Managed Care - PPO | Admitting: Nurse Practitioner

## 2018-08-13 ENCOUNTER — Encounter: Payer: Self-pay | Admitting: Nurse Practitioner

## 2018-08-13 DIAGNOSIS — J4550 Severe persistent asthma, uncomplicated: Secondary | ICD-10-CM | POA: Diagnosis not present

## 2018-08-13 MED ORDER — AZELASTINE-FLUTICASONE 137-50 MCG/ACT NA SUSP: NASAL | 0 refills | Status: DC

## 2018-08-13 NOTE — Assessment & Plan Note (Signed)
Patient States that this is been a stable interval for her.  She has not had any exacerbations since her last visit.  Patient is compliant with Breo, Singulair, and Dymista.  Patient was started on Dupixent and states that this is been working great for her.  She stopped her Spiriva at the end of April per Dr. Anastasia Pall recommendations.  She states that she has been doing well without it.  She is working as a Marine scientist.  She is using precautions during this current COVID pandemic.  She did have to take her husband to get tested for COVID today because he woke up this morning with 101 F fever.  She does not think that this was related to [place] she thinks that he has a UTI and states that since he is started on an antibiotic he is doing better.  She will notify us of the results once she gets that information.  Patient Instructions  Severe persistent allergic asthma Continue Dupixent injections Continue taking Breo Continue montelukast Continue albuterol as needed for chest tightness wheezing or shortness of breath  Allergic rhinitis: Continue Dymista  Follow up: Follow up with Dr. Lake Bells in 3 months or sooner if needed

## 2018-08-13 NOTE — Patient Instructions (Addendum)
Severe persistent allergic asthma Continue Dupixent injections Continue taking Breo Continue montelukast Continue albuterol as needed for chest tightness wheezing or shortness of breath  Allergic rhinitis: Continue Dymista  Follow up: Follow up with Dr. Lake Bells in 3 months or sooner if needed

## 2018-08-13 NOTE — Progress Notes (Signed)
Virtual Visit via Telephone Note  I connected with Katherine Henry on 08/13/18 at  1:30 PM EDT by telephone and verified that I am speaking with the correct person using two identifiers.  Location: Patient: home Provider: office   I discussed the limitations, risks, security and privacy concerns of performing an evaluation and management service by telephone and the availability of in person appointments. I also discussed with the patient that there may be a patient responsible charge related to this service. The patient expressed understanding and agreed to proceed.   History of Present Illness: 58 year old female former smoker with asthma who is followed by Dr. Lake Bells. Maintenance: Breo, Singulair, Dupixent  Patient has a tele-visit today for a follow-up.  She was last seen by Dr. Lake Bells on 05/14/2018.  States that this is been a stable interval for her.  She has not had any exacerbations since her last visit.  Patient is compliant with Breo, Singulair, and Dymista.  Patient was started on Dupixent and states that this is been working great for her.  She stopped her Spiriva at the end of April per Dr. Anastasia Pall recommendations.  She states that she has been doing well without it.  She is working as a Marine scientist.  She is using precautions during this current COVID pandemic.  She did have to take her husband to get tested for COVID today because he woke up this morning with 101 F fever.  She does not think that this was related to [place] she thinks that he has a UTI and states that since he is started on an antibiotic he is doing better.  She will notify us of the results once she gets that information. Denies f/c/s, n/v/d, hemoptysis, PND, leg swelling.      Observations/Objective:  Chest imaging: 07/2016 CT chest images personally reviewed: pulmonary parenchyma is unremarkable, airways are normal in appearance, however the esophagus is massively dilated with an air-fluid level, LAP-BAND noted 2016  CT chest showed a "postobstructive pneumonia" and right lower lobe infrahilar mass December 2016 CT chest showed complete resolution of the pneumonia, nodules, and mass  Other imaging: Upper GI April 2019 showed a patent lap band, esophageal dysmotility  Exhaled nitric oxide testing: December 2019 while taking Breo 105 ppb  PFT: December 2019 PFT ratio 67%, FEV1 1.9 L 78% predicted, TLC 5.1 L 111% predicted, residual volume 110% predicted, DLCO 26.1 mL 128%.  Labs: December 2019 IgE normal, serum eosinophil count 300 cells per deciliter     Assessment and Plan:  Patient States that this is been a stable interval for her.  She has not had any exacerbations since her last visit.  Patient is compliant with Breo, Singulair, and Dymista.  Patient was started on Dupixent and states that this is been working great for her.  She stopped her Spiriva at the end of April per Dr. Anastasia Pall recommendations.  She states that she has been doing well without it.  She is working as a Marine scientist.  She is using precautions during this current COVID pandemic.  She did have to take her husband to get tested for COVID today because he woke up this morning with 101 F fever.  She does not think that this was related to [place] she thinks that he has a UTI and states that since he is started on an antibiotic he is doing better.  She will notify us of the results once she gets that information.  Patient Instructions  Severe persistent allergic asthma  Continue Dupixent injections Continue taking Breo Continue montelukast Continue albuterol as needed for chest tightness wheezing or shortness of breath  Allergic rhinitis: Continue Dymista   Follow Up Instructions: Follow up with Dr. Lake Bells in 3 months or sooner if needed    I discussed the assessment and treatment plan with the patient. The patient was provided an opportunity to ask questions and all were answered. The patient agreed with the plan and  demonstrated an understanding of the instructions.   The patient was advised to call back or seek an in-person evaluation if the symptoms worsen or if the condition fails to improve as anticipated.  I provided 22 minutes of non-face-to-face time during this encounter.   Fenton Foy, NP

## 2018-08-15 NOTE — Progress Notes (Signed)
Reviewed, agree 

## 2018-08-23 ENCOUNTER — Telehealth: Payer: Self-pay | Admitting: Pulmonary Disease

## 2018-08-23 NOTE — Telephone Encounter (Signed)
Spoke with pt to let her know her P/A was renewed till 07/21/2019.  Pt was not aware. Dupixent my way did not call or send her notification, that she was approved. She was glad to know and thanked me for calling. Nothing further needed.

## 2018-09-09 ENCOUNTER — Other Ambulatory Visit: Payer: Self-pay | Admitting: Pulmonary Disease

## 2018-11-02 ENCOUNTER — Telehealth: Payer: Self-pay | Admitting: Pulmonary Disease

## 2018-11-02 NOTE — Telephone Encounter (Signed)
This is a former pt of Dr. Kathee Delton  Put pt w/ Dr Vaughan Browner for televisit  Didn't know that pt wanted appt until I had sent message so this needs to go to Dr Vaughan Browner  Pt had questions a/b N-95 equipment and her returning to work.Katherine Henry

## 2018-11-02 NOTE — Telephone Encounter (Signed)
She has a televisit on 8/24 so she can advise at this appt. I spoke with pt, she states she can wait until then. Nothing further is needed.

## 2018-11-08 ENCOUNTER — Other Ambulatory Visit: Payer: Self-pay

## 2018-11-08 ENCOUNTER — Ambulatory Visit (INDEPENDENT_AMBULATORY_CARE_PROVIDER_SITE_OTHER): Payer: Commercial Managed Care - PPO | Admitting: Pulmonary Disease

## 2018-11-08 ENCOUNTER — Encounter: Payer: Self-pay | Admitting: Pulmonary Disease

## 2018-11-08 DIAGNOSIS — J4550 Severe persistent asthma, uncomplicated: Secondary | ICD-10-CM

## 2018-11-08 NOTE — Progress Notes (Signed)
Virtual Visit via Telephone Note  I connected with Katherine Henry on 11/08/18 at 11:00 AM EDT by telephone and verified that I am speaking with the correct person using two identifiers.  Location: Patient: Home Provider: [facility], [place], [place]   I discussed the limitations, risks, security and privacy concerns of performing an evaluation and management service by telephone and the availability of in person appointments. I also discussed with the patient that there may be a patient responsible charge related to this service. The patient expressed understanding and agreed to proceed.  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  She was previously on Spiriva which was stopped after initiation of Dupixent therapy.  States that her breathing is doing well. She has requested this appointment to discuss N95 mask usage.  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  She has some dyspnea when she has a mask and skin rash otherwise no worsening of asthma control overall Her employer has requested that we give clearance to continue using the 95 mask   Observations/Objective: PFTs 03/09/2018 FVC 2.8 [91%], FEV1 1.87 [78%], F/F 67, TLC 5.14 [111%], DLCO 26.05 [120%] Moderate obstruction with increased diffusion capacity.  CBC 02/16/2018-WBC 6.7, eos 4.7%, absolute eosinophil count 315 FENO December 2019-105  Assessment and Plan: Severe persistent asthma Stable on Dupixent, Breo and albuterol as needed Reviewed N95 mask usage with her. She has been using the mask for several months now without any asthma exacerbations Told her it safe to continue using the N95.  Follow Up Instructions: - Continue Dupixent and  inhaler therapy  Follow-up in 6 months.   I discussed the assessment and treatment plan with the patient. The patient was provided an opportunity to ask questions and all were answered. The patient agreed with the plan and demonstrated an understanding of the instructions.   The patient was advised to call back or seek an in-person evaluation if the symptoms worsen or if the condition fails to improve as anticipated.  I provided 25 minutes of non-face-to-face time during this encounter.   Katherine Garfinkel MD [facility] 11/08/2018, 11:20 AM

## 2018-11-08 NOTE — Patient Instructions (Signed)
I am glad you are doing well with regard to your breathing Continue on your inhaler therapy and Dupixent every 2 weeks It is safe to continue using your N95 mask.  I do not feel there is any increased risk of asthma exacerbation with the mask Follow-up in 6 months.

## 2018-11-11 ENCOUNTER — Other Ambulatory Visit: Payer: Self-pay | Admitting: Pulmonary Disease

## 2018-12-18 ENCOUNTER — Other Ambulatory Visit: Payer: Self-pay | Admitting: Pulmonary Disease

## 2018-12-24 ENCOUNTER — Telehealth: Payer: Self-pay | Admitting: Pulmonary Disease

## 2018-12-24 NOTE — Telephone Encounter (Signed)
Dupixent refilled per Rx Request interface. Nothing further needed at this time.

## 2019-01-06 ENCOUNTER — Telehealth: Payer: Self-pay | Admitting: Gastroenterology

## 2019-01-06 DIAGNOSIS — K5730 Diverticulosis of large intestine without perforation or abscess without bleeding: Secondary | ICD-10-CM

## 2019-01-06 NOTE — Telephone Encounter (Signed)
Pt called stating that she is having a diverticulosis flare up. She would like to be seen asap, first avail on 10/29, pt wants sooner.

## 2019-01-06 NOTE — Telephone Encounter (Signed)
I do know her well  Plan: -Levaquin 500 mg p.o. once a day x 10 days -Flagyl 500 mg p.o. 3 times daily x 10 days. -If not better in 3 days, check CBC,, CMP -Low fiber diet while on antibiotics. -If gets worse, need CT Abdo/pelvis with p.o. and IV contrast -Please make her a follow-up appointment in 3-4 weeks with me  Thx  RG

## 2019-01-06 NOTE — Telephone Encounter (Signed)
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

## 2019-01-07 MED ORDER — METRONIDAZOLE 500 MG PO TABS: 500.0000 mg | ORAL_TABLET | Freq: Three times a day (TID) | ORAL | 0 refills | Status: AC

## 2019-01-07 MED ORDER — LEVOFLOXACIN 500 MG PO TABS: 500.0000 mg | ORAL_TABLET | Freq: Every day | ORAL | 0 refills | Status: AC

## 2019-01-07 NOTE — Telephone Encounter (Signed)
Called and spoke with patient-patient informed of MD recommendations; patient is agreeable with plan of care and verified pharmacy to send RX-RX sent; orders for lab work and CT scan placed in Epic for future needs; patient advised to call back to the office in 3 days or after to update on symptoms in case lab work and CT are needed; Patient verbalized understanding of information/instructions;  Patient was advised to call the office at [phone] if questions/concerns arise;

## 2019-01-14 ENCOUNTER — Other Ambulatory Visit: Payer: Self-pay | Admitting: Pulmonary Disease

## 2019-01-18 ENCOUNTER — Other Ambulatory Visit: Payer: Self-pay | Admitting: Pulmonary Disease

## 2019-01-18 MED ORDER — MONTELUKAST SODIUM 10 MG PO TABS: 10.0000 mg | ORAL_TABLET | Freq: Every day | ORAL | 1 refills | Status: DC

## 2019-01-19 ENCOUNTER — Other Ambulatory Visit: Payer: Self-pay | Admitting: *Deleted

## 2019-01-19 MED ORDER — BREO ELLIPTA 200-25 MCG/INH IN AEPB: 1.0000 | INHALATION_SPRAY | Freq: Every day | RESPIRATORY_TRACT | 3 refills | Status: DC

## 2019-01-24 DIAGNOSIS — R87619 Unspecified abnormal cytological findings in specimens from cervix uteri: Secondary | ICD-10-CM | POA: Insufficient documentation

## 2019-01-28 ENCOUNTER — Ambulatory Visit: Payer: Commercial Managed Care - PPO | Admitting: Gastroenterology

## 2019-05-13 ENCOUNTER — Other Ambulatory Visit: Payer: Self-pay | Admitting: Pulmonary Disease

## 2019-05-13 NOTE — Telephone Encounter (Signed)
Pt requesting refill on Breo.  Pt last seen in 10/2018 and was told to follow up in 6 months.  No appt was scheduled for pt.   lmtcb X1 for pt to schedule follow up with Dr. Vaughan Browner.  Will refill med after speaking to patient.

## 2019-05-18 NOTE — Telephone Encounter (Signed)
Patient has appointment scheduled for April and I have sent in her refill and made her aware.

## 2019-06-14 ENCOUNTER — Other Ambulatory Visit: Payer: Self-pay | Admitting: Podiatry

## 2019-06-14 ENCOUNTER — Other Ambulatory Visit: Payer: Self-pay

## 2019-06-14 ENCOUNTER — Ambulatory Visit: Payer: Commercial Managed Care - PPO | Admitting: Podiatry

## 2019-06-14 ENCOUNTER — Ambulatory Visit (INDEPENDENT_AMBULATORY_CARE_PROVIDER_SITE_OTHER): Payer: Commercial Managed Care - PPO

## 2019-06-14 DIAGNOSIS — M79672 Pain in left foot: Secondary | ICD-10-CM | POA: Diagnosis not present

## 2019-06-14 DIAGNOSIS — M19072 Primary osteoarthritis, left ankle and foot: Secondary | ICD-10-CM

## 2019-06-14 DIAGNOSIS — M249 Joint derangement, unspecified: Secondary | ICD-10-CM

## 2019-06-14 DIAGNOSIS — M24173 Other articular cartilage disorders, unspecified ankle: Secondary | ICD-10-CM

## 2019-06-14 DIAGNOSIS — M19079 Primary osteoarthritis, unspecified ankle and foot: Secondary | ICD-10-CM

## 2019-06-14 NOTE — Progress Notes (Signed)
  Subjective:  Patient ID: Katherine Henry, female    DOB: May 10, 1960,  MRN: [id number]  Chief Complaint  Patient presents with  . Foot Pain    Lt midfoot pain radiates to ankle x 2 wks; 7-8/10 shar painas after 10 hr shifts -pt denies injury -w/ swelllgin -wrose bein up on it all day/flexing Tx;: change goes constanly, icing, and IBU     59 y.o. female presents with the above complaint. History confirmed with patient.   Objective:  Physical Exam: warm, good capillary refill, no trophic changes or ulcerative lesions, normal DP and PT pulses and normal sensory exam. Left Foot: tenderness over the 2nd TMT joint with prominent osteophytes   No images are attached to the encounter.  Radiographs: X-ray of the left foot: degenerative changes of the dorsal tarsometatarsal joints, wispy changes to the ankle joint joint Assessment:   1. Arthritis, midfoot   2. Pain in left foot   3. Joint derangement of ankle or foot      Plan:  Patient was evaluated and treated and all questions answered.  Arthritis -Educated on etiology -XR reviewed with patient -Discussed padding and proper shoegear, lacing changes -Injection delivered to the painful joint  Procedure: Joint Injection Location: Left dorsal 2nd TMT joint Skin Prep: Alcohol. Injectate: 0.5 cc 1% lidocaine plain, 0.5 cc dexamethasone phosphate. Disposition: Patient tolerated procedure well. Injection site dressed with a band-aid.   Return in about 3 weeks (around 07/05/2019) for Arthritis.

## 2019-06-15 ENCOUNTER — Other Ambulatory Visit: Payer: Self-pay | Admitting: Podiatry

## 2019-06-15 DIAGNOSIS — M19079 Primary osteoarthritis, unspecified ankle and foot: Secondary | ICD-10-CM

## 2019-07-05 ENCOUNTER — Telehealth: Payer: Self-pay | Admitting: Pharmacy Technician

## 2019-07-05 ENCOUNTER — Telehealth (INDEPENDENT_AMBULATORY_CARE_PROVIDER_SITE_OTHER): Payer: Commercial Managed Care - PPO | Admitting: Pulmonary Disease

## 2019-07-05 ENCOUNTER — Encounter: Payer: Self-pay | Admitting: Pulmonary Disease

## 2019-07-05 ENCOUNTER — Ambulatory Visit: Payer: Commercial Managed Care - PPO | Admitting: Pulmonary Disease

## 2019-07-05 DIAGNOSIS — Z9989 Dependence on other enabling machines and devices: Secondary | ICD-10-CM

## 2019-07-05 DIAGNOSIS — G4733 Obstructive sleep apnea (adult) (pediatric): Secondary | ICD-10-CM

## 2019-07-05 DIAGNOSIS — J4550 Severe persistent asthma, uncomplicated: Secondary | ICD-10-CM

## 2019-07-05 NOTE — Telephone Encounter (Signed)
Received Dupixent Fax request from [facility]. Completed and attached clinicals. Will place in MD box for signature. Will follow up.  10:24 AM Beatriz Chancellor, CPhT

## 2019-07-05 NOTE — Patient Instructions (Addendum)
I am glad you are doing well with regard to your breathing Continue Dupixent We will reduce Breo to 100 Continue CPAP therapy  Follow-up in 6 months.

## 2019-07-05 NOTE — Progress Notes (Signed)
Virtual Visit via Telephone Note  I connected with Katherine Henry on 07/05/19 at  3:45 PM EDT by telephone and verified that I am speaking with the correct person using two identifiers.  Location: Patient: Home Provider: [facility], [place], [place]   I discussed the limitations, risks, security and privacy concerns of performing an evaluation and management service by telephone and the availability of in person appointments. I also discussed with the patient that there may be a patient responsible charge related to this service. The patient expressed understanding and agreed to proceed.  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  She was previously on Spiriva which was stopped after initiation of Dupixent therapy which she is getting at home. She works at a Designer, fashion/clothing at [employer]   Observations/Objective: Patient is doing well the [place] therapy Wondering if she can come down on her inhaler therapy.  PFTs 03/09/2018 FVC 2.8 [91%], FEV1 1.87 [78%], F/F 67, TLC 5.14 [111%], DLCO 26.05 [120%] Moderate obstruction with increased diffusion capacity.  CBC 02/16/2018-WBC 6.7, eos 4.7%, absolute eosinophil count 315 FENO December 2019-105  Assessment and Plan: Severe persistent asthma Stable on Dupixent, Breo and albuterol as needed We can reduce Breo dose to 100  OSA On CPAP.  We will need to review download on return visit.  Follow Up Instructions: - Continue Dupixent and inhaler therapy - Reduce Breo to 100  Follow-up in 6 months.   I discussed the assessment and treatment plan with the patient. The patient was provided an opportunity to ask questions and all were answered. The patient agreed with the plan and demonstrated an understanding of  the instructions.   The patient was advised to call back or seek an in-person evaluation if the symptoms worsen or if the condition fails to improve as anticipated.  I provided 25 minutes of non-face-to-face time during this encounter.   [name] MD [facility] 07/05/2019, 4:09 PM

## 2019-07-12 ENCOUNTER — Other Ambulatory Visit: Payer: Self-pay | Admitting: Pulmonary Disease

## 2019-07-12 ENCOUNTER — Ambulatory Visit (INDEPENDENT_AMBULATORY_CARE_PROVIDER_SITE_OTHER): Payer: Commercial Managed Care - PPO | Admitting: Podiatry

## 2019-07-12 ENCOUNTER — Other Ambulatory Visit: Payer: Self-pay

## 2019-07-12 DIAGNOSIS — M7752 Other enthesopathy of left foot: Secondary | ICD-10-CM | POA: Diagnosis not present

## 2019-07-12 DIAGNOSIS — M19079 Primary osteoarthritis, unspecified ankle and foot: Secondary | ICD-10-CM

## 2019-07-12 MED ORDER — MELOXICAM 15 MG PO TABS
15.0000 mg | ORAL_TABLET | Freq: Every day | ORAL | 0 refills | Status: DC
Start: 2019-07-12 — End: 2019-08-09

## 2019-07-12 NOTE — Progress Notes (Signed)
  Subjective:  Patient ID: Katherine Henry, female    DOB: 1960/06/25,  MRN: [id number]  Chief Complaint  Patient presents with  . Arthritis    F/U Lt arthritis -pt states the shoe worked fo a week and started hurting again. It hurts very bad, constant 8/10 pain." -w/ swellgin tx: IBU and tyleol -pt also c/o pain at Lt 2nd toe and abnormal turning    59 y.o. female presents with the above complaint. History confirmed with patient.   Objective:  Physical Exam: warm, good capillary refill, no trophic changes or ulcerative lesions, normal DP and PT pulses and normal sensory exam. Left Foot: tenderness over the 2nd TMT joint with prominent osteophytes, 2nd MPJ  Assessment:   1. Arthritis, midfoot   2. Capsulitis of metatarsophalangeal (MTP) joint of left foot    Plan:  Patient was evaluated and treated and all questions answered.  Arthritis -Repeat injection dorsal midfoot  Capsulitis 2nd MPJ left -Injetion delivered as below  Procedure: Joint Injection Location: Left 2nd TMT, 2nd MPJ joint Skin Prep: Alcohol. Injectate: 0.5 cc 1% lidocaine plain, 0.5 cc dexamethasone phosphate. Disposition: Patient tolerated procedure well. Injection site dressed with a band-aid.  No follow-ups on file.

## 2019-07-12 NOTE — Telephone Encounter (Signed)
Faxed signed Prior Authorization request to [facility]. Will follow up once we receive a response.  Fax# [phone] Phone# [phone]

## 2019-07-14 NOTE — Telephone Encounter (Signed)
Received notification from [facility] regarding a prior authorization for [place]. Authorization has been APPROVED from 07/12/19 to 07/11/20.   Authorization # [id number] Phone # [phone]  9:36 AM Beatriz Chancellor, CPhT

## 2019-08-08 ENCOUNTER — Other Ambulatory Visit: Payer: Self-pay

## 2019-08-08 ENCOUNTER — Ambulatory Visit: Payer: Commercial Managed Care - PPO | Admitting: Podiatry

## 2019-08-08 DIAGNOSIS — M7672 Peroneal tendinitis, left leg: Secondary | ICD-10-CM

## 2019-08-08 DIAGNOSIS — M19079 Primary osteoarthritis, unspecified ankle and foot: Secondary | ICD-10-CM | POA: Diagnosis not present

## 2019-08-08 DIAGNOSIS — M779 Enthesopathy, unspecified: Secondary | ICD-10-CM | POA: Diagnosis not present

## 2019-08-08 NOTE — Progress Notes (Signed)
  Subjective:  Patient ID: Katherine Henry, female    DOB: [birth year],  MRN: [id number]  Chief Complaint  Patient presents with  . Arthritis    F/Y lt arthritis/capsuitis Pt. states," it's better, pain depend how much I'm on it; 4/10 occasional pain." tx: icign and icy hot -same with swellgin    59 y.o. female presents with the above complaint. History confirmed with patient.   Objective:  Physical Exam: warm, good capillary refill, no trophic changes or ulcerative lesions, normal DP and PT pulses and normal sensory exam. Left Foot: tenderness over the 2nd TMT joint with prominent osteophytes, 5th met base and peroneal tendons.  Assessment:   1. Arthritis, midfoot   2. Enthesitis   3. Peroneal tendinitis of left lower leg    Plan:  Patient was evaluated and treated and all questions answered.  Arthritis -Final repeat injection dorsal midfoot left  Procedure: Joint Injection Location: Left dorsal 2nd TMT joint Skin Prep: Alcohol. Injectate: 0.5 cc 1% lidocaine plain, 0.5 cc dexamethasone phosphate. Disposition: Patient tolerated procedure well. Injection site dressed with a band-aid.  5th met enthesitis left -Dispense trilock brace left -Discussed possible worsening of dorsal midfoot symptoms from rubbing.   No follow-ups on file.

## 2019-08-09 ENCOUNTER — Telehealth: Payer: Self-pay | Admitting: Pulmonary Disease

## 2019-08-09 ENCOUNTER — Other Ambulatory Visit: Payer: Self-pay | Admitting: Podiatry

## 2019-08-09 MED ORDER — BREO ELLIPTA 100-25 MCG/INH IN AEPB: 1.0000 | INHALATION_SPRAY | Freq: Every day | RESPIRATORY_TRACT | 1 refills | Status: DC

## 2019-08-09 NOTE — Telephone Encounter (Signed)
Spoke with Amy at [facility] and she stated that the patient report decreasing the dosage of Breo. According to Dr. Vaughan Browner note he did want to decrease the Breo to 100. Rx sent in and nothing further is needed.   07/05/2019, 4:09 PM     Patient Instructions by [name], MD at 07/05/2019 3:45 PM Author: [name], MD Author Type: Physician Filed: 07/05/2019 4:22 PM  Note Status: Addendum Katherine Henry: Cosign Not Required Encounter Date: 07/05/2019  Editor: [name], MD (Physician)  Prior Versions: 1. [name], MD (Physician) at 07/05/2019 4:20 PM - Signed    I am glad you are doing well with regard to your breathing Continue Dupixent We will reduce Breo to 100 Continue CPAP therapy  Follow-up in 6 months.

## 2019-09-20 ENCOUNTER — Other Ambulatory Visit: Payer: Self-pay | Admitting: Podiatry

## 2019-10-10 ENCOUNTER — Other Ambulatory Visit: Payer: Self-pay | Admitting: Podiatry

## 2019-10-10 ENCOUNTER — Ambulatory Visit (INDEPENDENT_AMBULATORY_CARE_PROVIDER_SITE_OTHER): Payer: Commercial Managed Care - PPO | Admitting: Podiatry

## 2019-10-10 ENCOUNTER — Ambulatory Visit (INDEPENDENT_AMBULATORY_CARE_PROVIDER_SITE_OTHER): Payer: Commercial Managed Care - PPO

## 2019-10-10 ENCOUNTER — Other Ambulatory Visit: Payer: Self-pay

## 2019-10-10 DIAGNOSIS — M79672 Pain in left foot: Secondary | ICD-10-CM

## 2019-10-10 DIAGNOSIS — M19079 Primary osteoarthritis, unspecified ankle and foot: Secondary | ICD-10-CM

## 2019-10-10 DIAGNOSIS — S93492A Sprain of other ligament of left ankle, initial encounter: Secondary | ICD-10-CM | POA: Diagnosis not present

## 2019-10-10 DIAGNOSIS — M19072 Primary osteoarthritis, left ankle and foot: Secondary | ICD-10-CM | POA: Diagnosis not present

## 2019-10-10 NOTE — Progress Notes (Signed)
  Subjective:  Patient ID: Katherine Henry, female    DOB: 1960-06-30,  MRN: [id number]  Chief Complaint  Patient presents with  . 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 female presents with the above complaint. History confirmed with patient.   Objective:  Physical Exam: warm, good capillary refill, no trophic changes or ulcerative lesions, normal DP and PT pulses and normal sensory exam. Left Foot: POP left ATFL, CFL, anterior ankle, neg anterior drawer, talar tilt.    No images are attached to the encounter.  Radiographs: X-ray of the left ankle: no fracture, dislocation, swelling or degenerative changes noted Assessment:   1. Sprain of anterior talofibular ligament of left ankle, initial encounter   2. Arthritis, midfoot    Plan:  Patient was evaluated and treated and all questions answered.  Sprain left ankle -XR reviewed -Wear trilock left -Rest, ice, elevate  Midfoot arthritis left -Injection next visit  Return in about 6 weeks (around 11/21/2019) for Sprain left ankle f/u .

## 2019-10-14 ENCOUNTER — Other Ambulatory Visit: Payer: Self-pay | Admitting: Podiatry

## 2019-10-14 DIAGNOSIS — M19079 Primary osteoarthritis, unspecified ankle and foot: Secondary | ICD-10-CM

## 2019-10-25 ENCOUNTER — Other Ambulatory Visit: Payer: Self-pay | Admitting: Podiatry

## 2019-11-25 ENCOUNTER — Other Ambulatory Visit: Payer: Self-pay | Admitting: Podiatry

## 2019-12-01 ENCOUNTER — Encounter: Payer: Self-pay | Admitting: Podiatry

## 2019-12-01 ENCOUNTER — Ambulatory Visit: Payer: Commercial Managed Care - PPO | Admitting: Podiatry

## 2019-12-01 ENCOUNTER — Other Ambulatory Visit: Payer: Self-pay

## 2019-12-01 DIAGNOSIS — M19072 Primary osteoarthritis, left ankle and foot: Secondary | ICD-10-CM

## 2019-12-01 DIAGNOSIS — M19079 Primary osteoarthritis, unspecified ankle and foot: Secondary | ICD-10-CM

## 2019-12-01 DIAGNOSIS — S93492A Sprain of other ligament of left ankle, initial encounter: Secondary | ICD-10-CM

## 2019-12-01 NOTE — Progress Notes (Signed)
  Subjective:  Patient ID: Katherine Henry, female    DOB: 26-Oct-1960,  MRN: [id number]  Chief Complaint  Patient presents with  . Foot Problem    there is some swelling and soreness to the left ankle    59 y.o. female presents with the above complaint. History confirmed with patient.   Objective:  Physical Exam: warm, good capillary refill, no trophic changes or ulcerative lesions, normal DP and PT pulses and normal sensory exam. Left Foot: no pain to palpation ATFL, CFL. POP dorsal midfoot with prominent osteophyte Assessment:   1. Sprain of anterior talofibular ligament of left ankle, initial encounter   2. Arthritis, midfoot    Plan:  Patient was evaluated and treated and all questions answered.  Sprain left ankle -Improved. D/c ankle brace  Midfoot arthritis left -Injection as below  Procedure: Joint Injection Location: Left dorsal 2nd TMT joint Skin Prep: Alcohol. Injectate: 0.5 cc 1% lidocaine plain, 0.5 cc dexamethasone phosphate. Disposition: Patient tolerated procedure well. Injection site dressed with a band-aid.    No follow-ups on file.

## 2020-01-10 ENCOUNTER — Other Ambulatory Visit: Payer: Self-pay | Admitting: Podiatry

## 2020-01-25 ENCOUNTER — Other Ambulatory Visit: Payer: Self-pay | Admitting: Pulmonary Disease

## 2020-02-15 ENCOUNTER — Other Ambulatory Visit: Payer: Self-pay | Admitting: Pulmonary Disease

## 2020-02-21 ENCOUNTER — Other Ambulatory Visit: Payer: Self-pay | Admitting: Podiatry

## 2020-02-27 ENCOUNTER — Other Ambulatory Visit: Payer: Self-pay

## 2020-02-27 ENCOUNTER — Encounter: Payer: Self-pay | Admitting: Podiatry

## 2020-02-27 ENCOUNTER — Other Ambulatory Visit: Payer: Self-pay | Admitting: *Deleted

## 2020-02-27 ENCOUNTER — Ambulatory Visit (INDEPENDENT_AMBULATORY_CARE_PROVIDER_SITE_OTHER): Payer: Commercial Managed Care - PPO | Admitting: Podiatry

## 2020-02-27 DIAGNOSIS — M19072 Primary osteoarthritis, left ankle and foot: Secondary | ICD-10-CM

## 2020-02-27 DIAGNOSIS — M19079 Primary osteoarthritis, unspecified ankle and foot: Secondary | ICD-10-CM

## 2020-02-27 MED ORDER — DEXAMETHASONE SODIUM PHOSPHATE 120 MG/30ML IJ SOLN
2.0000 mg | Freq: Once | INTRAMUSCULAR | Status: AC
  Administered 2020-02-27: 15:00:00 2 mg via INTRA_ARTICULAR

## 2020-02-27 NOTE — Progress Notes (Signed)
°  Subjective:  Patient ID: Katherine Henry, female    DOB: [birth year],  MRN: [id number]  Chief Complaint  Patient presents with   Foot Problem    I am hurting some in the left foot and there is not much swelling at all and does throb and the injection did help    59 y.o. female presents with the above complaint. History confirmed with patient.   Objective:  Physical Exam: warm, good capillary refill, no trophic changes or ulcerative lesions, normal DP and PT pulses and normal sensory exam. Left Foot: no pain to palpation ATFL, CFL. POP dorsal midfoot with prominent osteophyte Assessment:   1. Arthritis, midfoot    Plan:  Patient was evaluated and treated and all questions answered.  Midfoot arthritis left -Injection as below  Procedure: Joint Injection Location: Left dorsal 2nd TMT joint Skin Prep: Alcohol. Injectate: 0.5 cc 1% lidocaine plain, 0.5 cc dexamethasone phosphate. Disposition: Patient tolerated procedure well. Injection site dressed with a band-aid.  No follow-ups on file.

## 2020-04-05 ENCOUNTER — Other Ambulatory Visit: Payer: Self-pay | Admitting: Gastroenterology

## 2020-04-05 DIAGNOSIS — K5730 Diverticulosis of large intestine without perforation or abscess without bleeding: Secondary | ICD-10-CM

## 2020-04-11 ENCOUNTER — Other Ambulatory Visit: Payer: Self-pay | Admitting: Podiatry

## 2020-04-30 ENCOUNTER — Other Ambulatory Visit: Payer: Self-pay | Admitting: Pulmonary Disease

## 2020-05-14 ENCOUNTER — Other Ambulatory Visit: Payer: Self-pay | Admitting: Podiatry

## 2020-05-14 NOTE — Telephone Encounter (Signed)
Please advise 

## 2020-05-28 ENCOUNTER — Telehealth: Payer: Self-pay | Admitting: Pulmonary Disease

## 2020-05-28 NOTE — Telephone Encounter (Signed)
LMTCB  PM please advise if upcoming patient appt can be a video visit. Previous visit was a video visit as well. Thanks :)

## 2020-05-29 NOTE — Telephone Encounter (Signed)
Yes. It would be fine to do a video visirt

## 2020-05-29 NOTE — Telephone Encounter (Signed)
Called and spoke with patient. She did confirm that [place] is currently [place] and would prefer to have the visit on 3/22 turned into a televisit. I advised her that I would go ahead and convert this to a televisit to help her out. She verbalized understanding.   Nothing further needed at time of call.

## 2020-05-30 ENCOUNTER — Ambulatory Visit: Payer: Commercial Managed Care - PPO | Admitting: Pulmonary Disease

## 2020-06-05 ENCOUNTER — Telehealth: Payer: Self-pay | Admitting: Adult Health

## 2020-06-05 ENCOUNTER — Encounter: Payer: Self-pay | Admitting: Adult Health

## 2020-06-05 ENCOUNTER — Other Ambulatory Visit: Payer: Self-pay

## 2020-06-05 ENCOUNTER — Ambulatory Visit (INDEPENDENT_AMBULATORY_CARE_PROVIDER_SITE_OTHER): Payer: Commercial Managed Care - PPO | Admitting: Adult Health

## 2020-06-05 DIAGNOSIS — J4520 Mild intermittent asthma, uncomplicated: Secondary | ICD-10-CM

## 2020-06-05 DIAGNOSIS — T7840XA Allergy, unspecified, initial encounter: Secondary | ICD-10-CM

## 2020-06-05 MED ORDER — MONTELUKAST SODIUM 10 MG PO TABS: 10.0000 mg | ORAL_TABLET | Freq: Every day | ORAL | 11 refills | Status: DC

## 2020-06-05 MED ORDER — ALBUTEROL SULFATE HFA 108 (90 BASE) MCG/ACT IN AERS: 2.0000 | INHALATION_SPRAY | Freq: Four times a day (QID) | RESPIRATORY_TRACT | 5 refills | Status: DC | PRN

## 2020-06-05 MED ORDER — MONTELUKAST SODIUM 10 MG PO TABS: 10.0000 mg | ORAL_TABLET | Freq: Every day | ORAL | 3 refills | Status: AC

## 2020-06-05 MED ORDER — DUPIXENT 300 MG/2ML ~~LOC~~ SOSY: PREFILLED_SYRINGE | SUBCUTANEOUS | 1 refills | Status: DC

## 2020-06-05 MED ORDER — ALBUTEROL SULFATE HFA 108 (90 BASE) MCG/ACT IN AERS: 2.0000 | INHALATION_SPRAY | Freq: Four times a day (QID) | RESPIRATORY_TRACT | 5 refills | Status: AC | PRN

## 2020-06-05 MED ORDER — FLUTICASONE FUROATE-VILANTEROL 100-25 MCG/INH IN AEPB: 1.0000 | INHALATION_SPRAY | Freq: Every day | RESPIRATORY_TRACT | Status: DC

## 2020-06-05 MED ORDER — BREO ELLIPTA 100-25 MCG/INH IN AEPB: 1.0000 | INHALATION_SPRAY | Freq: Every day | RESPIRATORY_TRACT | 1 refills | Status: DC

## 2020-06-05 MED ORDER — EPINEPHRINE 1 MG/10ML IJ SOSY: 0.1000 mg | PREFILLED_SYRINGE | Freq: Once | INTRAMUSCULAR | Status: DC

## 2020-06-05 MED ORDER — EPINEPHRINE PF 1 MG/ML IJ SOLN: 1.0000 mg | Freq: Once | INTRAMUSCULAR | 1 refills | Status: AC

## 2020-06-05 NOTE — Addendum Note (Signed)
Addended by: Trudi Ida on: 06/05/2020 04:00 PM   Modules accepted: Orders

## 2020-06-05 NOTE — Telephone Encounter (Signed)
Called and spoke with pharm and gave VO for the Epipen 0.3 mg  Nothing further needed

## 2020-06-05 NOTE — Patient Instructions (Addendum)
Continue on Dupixent Continue on Breo 1 puff daily, rinse after use Continue on Singulair daily Claritin 10 mg daily as needed Albuterol inhaler as needed. Continue to have EpiPen and use as needed for allergic reaction Activity as tolerated Continue on CPAP at bedtime Order for new CPAP machine sent to your DME company. CPAP download requested Do not drive if sleepy Work on healthy weight Follow-up with Dr. Vaughan Browner in 6 months and As needed

## 2020-06-05 NOTE — Progress Notes (Signed)
Virtual Visit via Telephone Note  I connected with Katherine Henry on 06/05/20 at  3:00 PM EDT by telephone and verified that I am speaking with the correct person using two identifiers.  Location: Patient: Home   Provider: Office    I discussed the limitations, risks, security and privacy concerns of performing an evaluation and management service by telephone and the availability of in person appointments. I also discussed with the patient that there may be a patient responsible charge related to this service. The patient expressed understanding and agreed to proceed.   History of Present Illness: 60 year old female followed for severe persistent asthma, T2 high phenotype with elevated peripheral eosinophils and elevated exhaled nitric oxide. Started Dupixent therapy in 2019 with improved clinical symptoms.  Does home injections Medical history significant for sleep apnea on CPAP  Today's telemedicine visit is a 1 year follow-up for asthma and sleep apnea.  Patient says overall her asthma is doing very well.  She says since starting [place] in 2019 she has had excellent control of her asthma.  Has not had to use her albuterol at all.  She remains on Breo inhaler daily.  Her dose of Breo was decreased to 100 last year and has had no flareup in breathing on the lower dose.  She says she is very active.  Has no significant shortness of breath or cough.  No significant wheezing. Patient says that she does have an EpiPen at home.  Needs a refill as her current one is going to expire.  Patient has underlying obstructive sleep apnea.  States she wears her CPAP every single night.  She usually gets in about 5 to 6 hours of sleep.  Says she cannot go without her CPAP.  Patient says her machine is getting old.  Needs a new machine.  Would like a new order for a CPAP machine.  Patient has an SD card and will go by her homecare company for a download. She denies any significant daytime  sleepiness.  Patient says she is moving to [place].  Past Medical History:  Diagnosis Date  . Allergy   . Asthma   . GERD (gastroesophageal reflux disease)   . H/O hiatal hernia   . Morbid obesity ([place])   . Multiple nodules of lung    right lung  . Multiple respiratory allergies   . OSA on CPAP    severe  . Plantar fasciitis    right foot  . Sleep apnea    Current Outpatient Medications on File Prior to Visit  Medication Sig Dispense Refill  . albuterol (PROVENTIL HFA;VENTOLIN HFA) 108 (90 BASE) MCG/ACT inhaler Inhale 2 puffs into the lungs every 6 (six) hours as needed for wheezing.    Marland Kitchen albuterol (PROVENTIL, VENTOLIN) (5 MG/ML) 0.5% NEBU Take 1 mL by nebulization 2 (two) times daily.    Marland Kitchen BREO ELLIPTA 100-25 MCG/INH AEPB Inhale 1 puff into the lungs daily. 180 each 1  . DUPIXENT 300 MG/2ML prefilled syringe INJECT 300MG  SUBCUTANEOUSLY EVERY OTHER WEEK 4 mL 1  . EPINEPHrine (ADRENALIN) 0.1 MG/ML injection Inject 0.3 mg into the vein once as needed.     . famotidine (PEPCID) 20 MG tablet Take 20 mg by mouth as needed.     Marland Kitchen ibuprofen (ADVIL) 600 MG tablet Take by mouth.    . loratadine (CLARITIN) 10 MG tablet Take 10 mg by mouth at bedtime.     . Loteprednol Etabonate 0.5 % GEL Lotemax 0.5 % eye gel drops  APPLY 1 DROP INTO BOTH EYE 4 TIMES DAILY FOR 1 WEEK, THEN TWICE DAILY FOR 1 WEEK, THEN STOP    . meloxicam (MOBIC) 15 MG tablet TAKE ONE TABLET (15mg ) BY MOUTH DAILY 30 tablet 0  . montelukast (SINGULAIR) 10 MG tablet Take 1 tablet (10 mg total) by mouth at bedtime. 90 tablet 3  . Multiple Vitamin (MULTIVITAMIN WITH MINERALS) TABS Take 1 tablet by mouth daily.    . Probiotic Product (ALIGN PO) Take by mouth daily.    . Wheat Dextrin (BENEFIBER) POWD Take by mouth. Mix 1 tsp daily in water    . zolpidem (AMBIEN) 10 MG tablet Take 10 mg by mouth at bedtime as needed for sleep.    Marland Kitchen amoxicillin (AMOXIL) 500 MG capsule Take 500 mg by mouth every 6 (six) hours. (Patient not  taking: Reported on 06/05/2020)    . traMADol (ULTRAM) 50 MG tablet Take 50 mg by mouth every 4 (four) hours as needed. (Patient not taking: Reported on 06/05/2020)     No current facility-administered medications on file prior to visit.       Observations/Objective: PFTs 03/09/2018 FVC 2.8 [91%], FEV1 1.87 [78%], F/F 67, TLC 5.14 [111%], DLCO 26.05 [120%] Moderate obstruction with increased diffusion capacity.  CBC 02/16/2018-WBC 6.7, eos 4.7%, absolute eosinophil count 315 FENO December 2019-105  Assessment and Plan: Severe persistent asthma with excellent control on current regimen.  Obstructive sleep apnea with good compliance on CPAP.  CPAP download is requested Order for new CPAP machine will be sent to homecare company.  Plan  Patient Instructions  Continue on Dupixent Continue on Breo 1 puff daily, rinse after use Continue on Singulair daily Claritin 10 mg daily as needed Albuterol inhaler as needed. Continue to have EpiPen and use as needed for allergic reaction Activity as tolerated Continue on CPAP at bedtime Order for new CPAP machine sent to your DME company. CPAP download requested Do not drive if sleepy Work on healthy weight Follow-up with Dr. Vaughan Browner in 6 months and As needed         Follow Up Instructions: Follow-up in 6 months with Dr. Vaughan Browner and As needed      I discussed the assessment and treatment plan with the patient. The patient was provided an opportunity to ask questions and all were answered. The patient agreed with the plan and demonstrated an understanding of the instructions.   The patient was advised to call back or seek an in-person evaluation if the symptoms worsen or if the condition fails to improve as anticipated.  I provided 22  minutes of non-face-to-face time during this encounter.   Rexene Edison, NP

## 2020-06-05 NOTE — Addendum Note (Signed)
Addended by: Trudi Ida on: 06/05/2020 03:40 PM   Modules accepted: Orders

## 2020-07-11 ENCOUNTER — Telehealth: Payer: Self-pay

## 2020-07-11 NOTE — Telephone Encounter (Signed)
Faxed PA form to [employer] (phone# [phone] fax# [phone]) regarding Dupixent 300mg /110mL. Will update once pending result is received.  Case ID: [id number] Member ID: [id number]

## 2020-07-17 NOTE — Telephone Encounter (Signed)
Received notification from  [employer]  regarding a prior authorization for [place]. Authorization has been APPROVED from 07/11/2020 to 07/11/2021. Plan requirements state that Rx must be filled through [facility].  [facility] phone# [phone] [facility] fax# [phone]

## 2020-07-19 ENCOUNTER — Telehealth: Payer: Self-pay | Admitting: Pulmonary Disease

## 2020-07-19 ENCOUNTER — Other Ambulatory Visit: Payer: Self-pay | Admitting: Podiatry

## 2020-07-19 NOTE — Telephone Encounter (Signed)
Submitted a Prior Authorization request to [facility] for [place] via CoverMyMeds. Will update once we receive a response.  Key: [id number]

## 2020-07-19 NOTE — Telephone Encounter (Signed)
Please advise 

## 2020-07-19 NOTE — Telephone Encounter (Signed)
Received notification from [facility] regarding a prior authorization for [place]. Authorization has been APPROVED from 07/19/2020 to 07/19/2021.   Authorization # [id number]  Called pt and left voicemail requesting return call to inform her of approval

## 2020-07-20 ENCOUNTER — Other Ambulatory Visit (HOSPITAL_COMMUNITY): Payer: Self-pay

## 2020-07-20 NOTE — Telephone Encounter (Signed)
Called pt again and left VM stating that we were able to get her medications approved through her new insurance, that she should be able to go ahead and get it refilled when she needs, and to reach back out to [place] if she has any questions or problems.

## 2020-07-30 NOTE — Telephone Encounter (Signed)
Please advise if you have been able to reach pt to further discuss this.

## 2020-07-31 NOTE — Telephone Encounter (Signed)
Approval was discussed with patient on 07/20/20 when she returned call. Closing encounter

## 2020-08-06 ENCOUNTER — Other Ambulatory Visit: Payer: Self-pay | Admitting: Pharmacist

## 2020-08-06 DIAGNOSIS — J4520 Mild intermittent asthma, uncomplicated: Secondary | ICD-10-CM

## 2020-08-06 DIAGNOSIS — J4550 Severe persistent asthma, uncomplicated: Secondary | ICD-10-CM

## 2020-08-06 MED ORDER — DUPIXENT 300 MG/2ML ~~LOC~~ SOSY: PREFILLED_SYRINGE | SUBCUTANEOUS | 4 refills | Status: AC

## 2020-08-06 NOTE — Telephone Encounter (Signed)
[facility] must [facility] now instead of [facility].  Called patient to advise - left VM with phone number for [facility].  Knox Saliva, PharmD, MPH Clinical Pharmacist (Rheumatology and Pulmonology)

## 2020-08-16 ENCOUNTER — Other Ambulatory Visit: Payer: Self-pay | Admitting: Pulmonary Disease

## 2020-09-18 ENCOUNTER — Other Ambulatory Visit: Payer: Self-pay | Admitting: Podiatry

## 2020-09-18 NOTE — Telephone Encounter (Signed)
Please advise 

## 2020-09-29 IMAGING — DX DG CHEST 2V
2 series · 2 of 2 positions shown · non-contrast
Comparison: CT scan of the chest August 01, 2016 and PA and lateral
chest x-ray August 30, 2012

CLINICAL DATA: One month history of dyspnea. History of asthma,
former smoker.

EXAM:
CHEST - 2 VIEW

[chest pa]
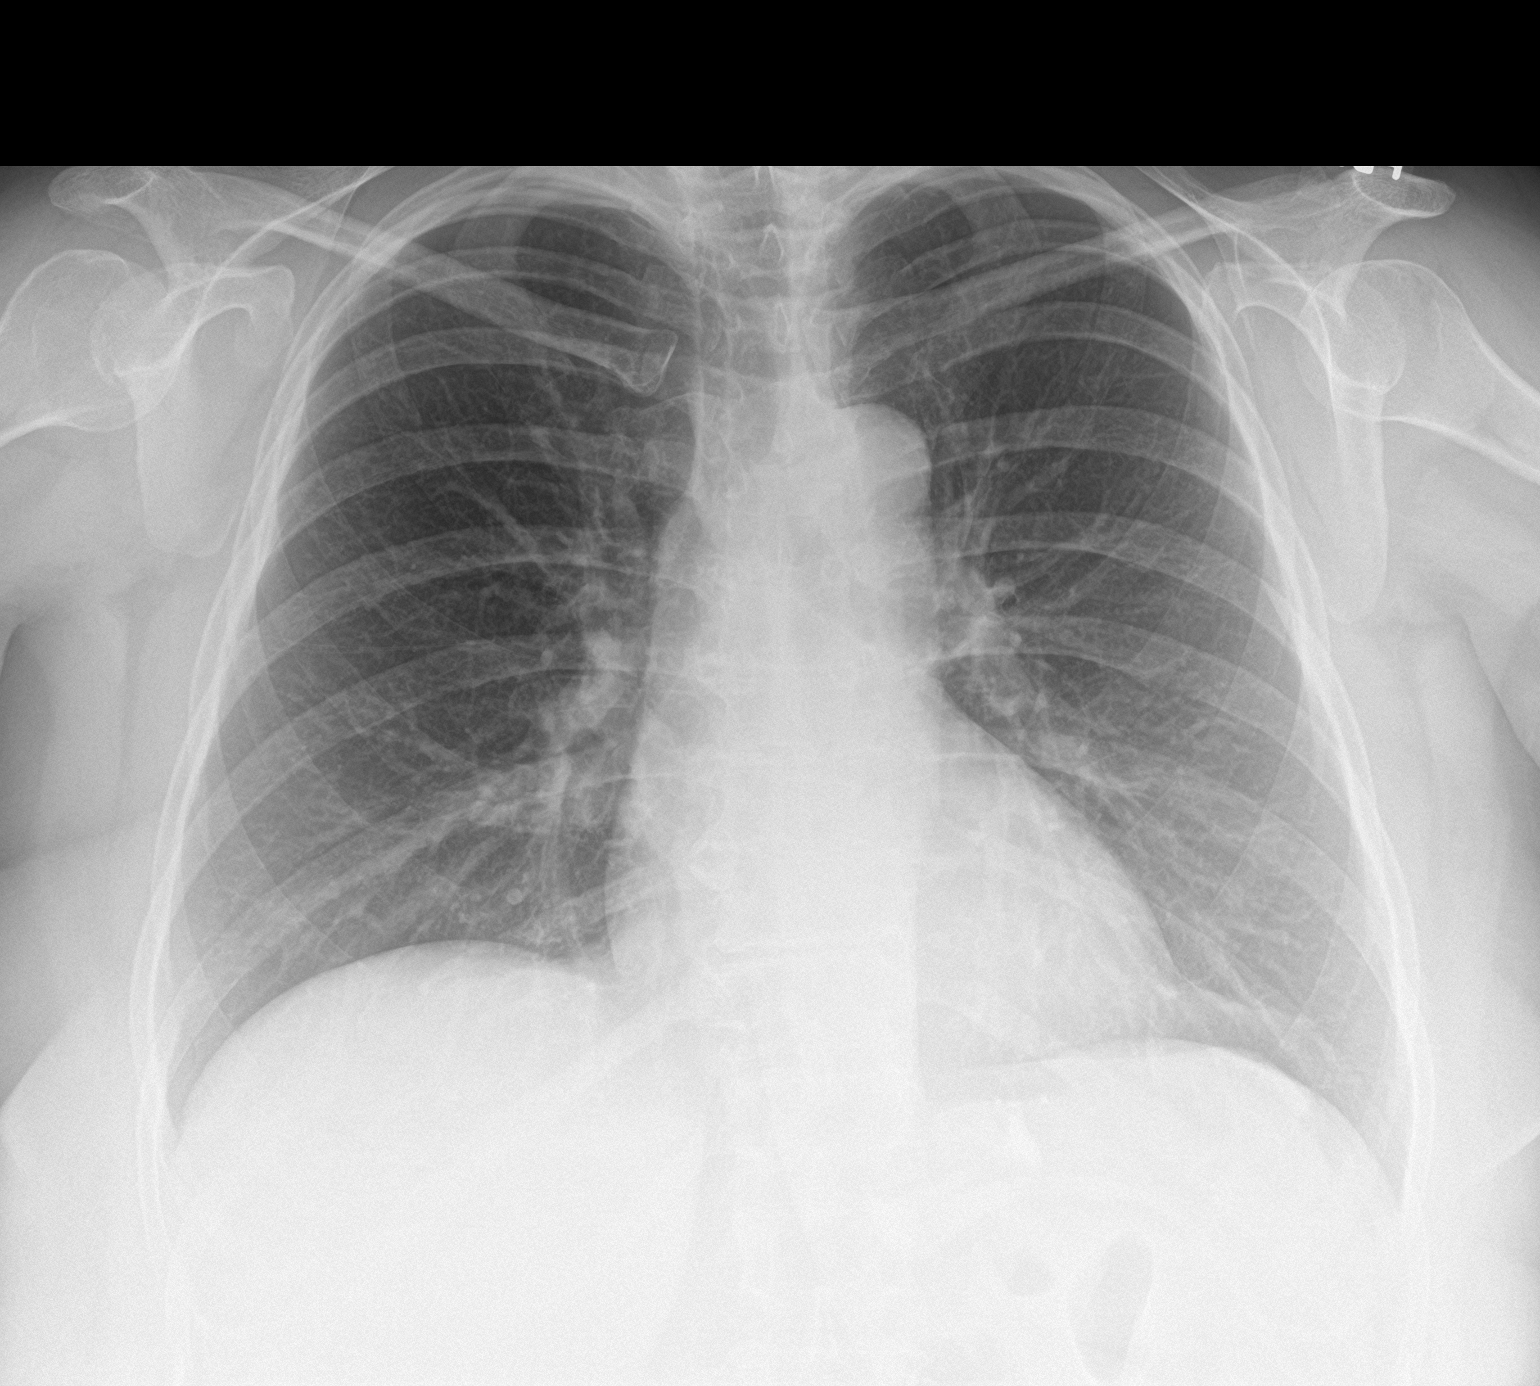

[chest lat]
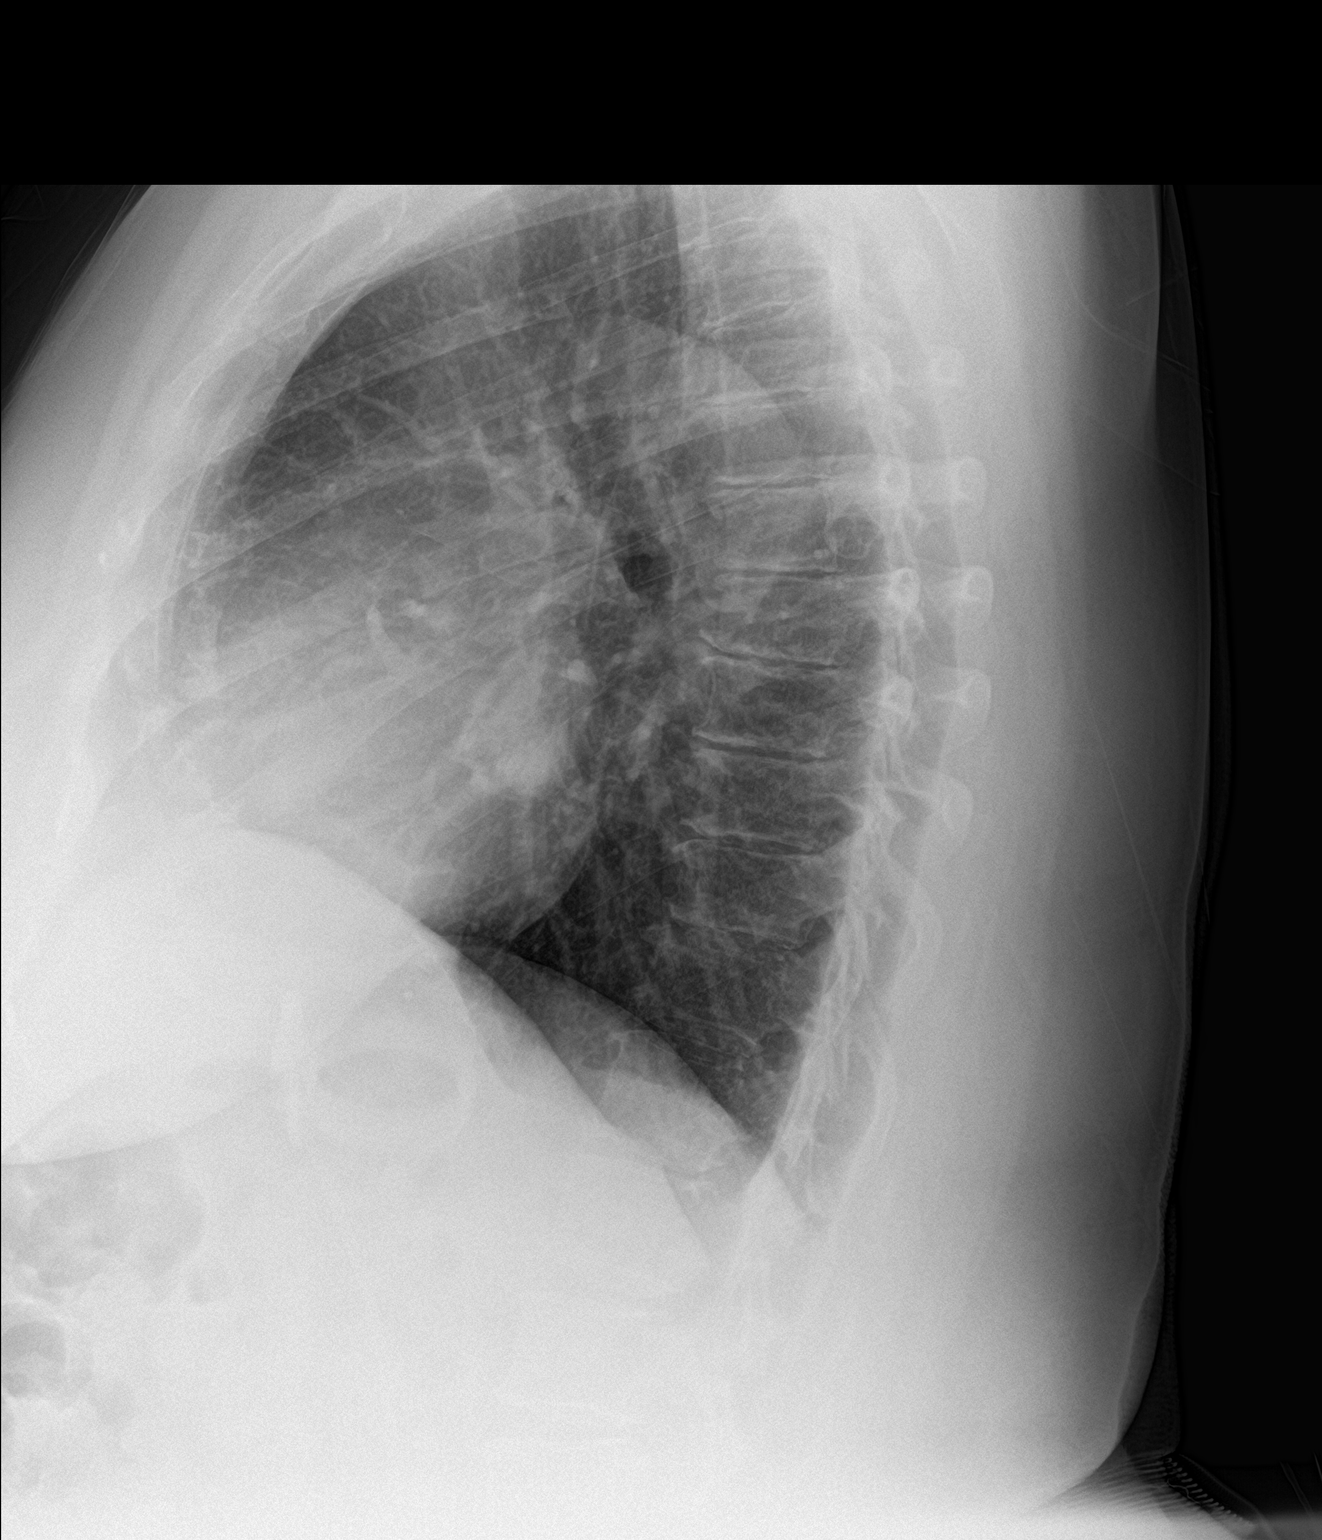

[2 of 2 positions shown; findings below may reference images not displayed]

FINDINGS: The lungs are well-expanded. There is no focal infiltrate. The lung
markings are coarse at the left base. The heart and pulmonary
vascularity are normal. The mediastinum is normal in width. The
trachea is midline. The bony thorax exhibits no acute abnormality.
There is multilevel degenerative disc disease of the thoracic spine.
IMPRESSION: Mild chronic bronchitic-reactive airway changes. Minimal
subsegmental atelectasis in the lingula. No alveolar pneumonia.

## 2020-10-31 ENCOUNTER — Telehealth: Payer: Self-pay | Admitting: Pulmonary Disease

## 2020-10-31 NOTE — Telephone Encounter (Signed)
Submitted a Prior Authorization request to [employer] for [place] via CoverMyMeds. Will update once we receive a response.   (KeyCecille Aver) [id number]

## 2020-10-31 NOTE — Telephone Encounter (Signed)
Pt now how has [employer] that was updated in her chart but just wanted to notify the pharmacy team for her Dupixent of the change.   Pls regard; [phone]

## 2020-11-05 NOTE — Telephone Encounter (Signed)
Received notification from [facility] regarding a prior authorization for [place]. Authorization has been APPROVED from 11/02/20 to 11/02/21.   Patient can [facility] to fill [facility]: [phone]  Authorization # [id number]

## 2021-02-13 ENCOUNTER — Telehealth: Payer: Self-pay

## 2021-02-13 NOTE — Telephone Encounter (Signed)
I have called and left a voicemail for patient to call and make an appointment with pre-visit for recall colonoscopy.

## 2021-06-24 ENCOUNTER — Telehealth: Payer: Self-pay

## 2021-06-24 NOTE — Telephone Encounter (Signed)
Received PA renewal form for Dupixent. Per encounter list, pt has since transferred care to [facility] in [place] with Dr. Justus Memory le. Faxing forms to clinic for f/u. ? ?Fax# [phone] ?Phone# [phone] ?
